# Patient Record
Sex: Female | Born: 1985 | Race: White | Hispanic: No | Marital: Married | Smoking: Former smoker
Health system: Southern US, Community
[De-identification: ages and names within clinical notes are randomized; demographics above are authoritative.]

## PROBLEM LIST (undated history)

## (undated) DIAGNOSIS — F329 Major depressive disorder, single episode, unspecified: Secondary | ICD-10-CM

## (undated) DIAGNOSIS — F32A Depression, unspecified: Secondary | ICD-10-CM

---

## 2016-09-27 ENCOUNTER — Encounter (HOSPITAL_COMMUNITY): Payer: Self-pay

## 2016-09-27 DIAGNOSIS — F3130 Bipolar disorder, current episode depressed, mild or moderate severity, unspecified: Secondary | ICD-10-CM | POA: Insufficient documentation

## 2016-09-27 DIAGNOSIS — Z23 Encounter for immunization: Secondary | ICD-10-CM | POA: Insufficient documentation

## 2016-09-27 DIAGNOSIS — F17200 Nicotine dependence, unspecified, uncomplicated: Secondary | ICD-10-CM | POA: Insufficient documentation

## 2016-09-27 DIAGNOSIS — R45851 Suicidal ideations: Secondary | ICD-10-CM | POA: Insufficient documentation

## 2016-09-27 LAB — COMPREHENSIVE METABOLIC PANEL
ALT: 20 U/L (ref 14–54)
AST: 28 U/L (ref 15–41)
Albumin: 4.1 g/dL (ref 3.5–5.0)
Alkaline Phosphatase: 38 U/L (ref 38–126)
Anion gap: 7 (ref 5–15)
BUN: 10 mg/dL (ref 6–20)
CHLORIDE: 106 mmol/L (ref 101–111)
CO2: 25 mmol/L (ref 22–32)
CREATININE: 0.78 mg/dL (ref 0.44–1.00)
Calcium: 8.9 mg/dL (ref 8.9–10.3)
Glucose, Bld: 119 mg/dL — ABNORMAL HIGH (ref 65–99)
POTASSIUM: 3.5 mmol/L (ref 3.5–5.1)
Sodium: 138 mmol/L (ref 135–145)
Total Bilirubin: 0.8 mg/dL (ref 0.3–1.2)
Total Protein: 6.5 g/dL (ref 6.5–8.1)

## 2016-09-27 LAB — RAPID URINE DRUG SCREEN, HOSP PERFORMED
AMPHETAMINES: NOT DETECTED
Barbiturates: NOT DETECTED
Benzodiazepines: NOT DETECTED
Cocaine: NOT DETECTED
OPIATES: NOT DETECTED
Tetrahydrocannabinol: POSITIVE — AB

## 2016-09-27 LAB — CBC
HCT: 38.3 % (ref 36.0–46.0)
Hemoglobin: 12.8 g/dL (ref 12.0–15.0)
MCH: 29 pg (ref 26.0–34.0)
MCHC: 33.4 g/dL (ref 30.0–36.0)
MCV: 86.7 fL (ref 78.0–100.0)
PLATELETS: 240 10*3/uL (ref 150–400)
RBC: 4.42 MIL/uL (ref 3.87–5.11)
RDW: 12.4 % (ref 11.5–15.5)
WBC: 9.4 10*3/uL (ref 4.0–10.5)

## 2016-09-27 LAB — ETHANOL

## 2016-09-27 LAB — SALICYLATE LEVEL

## 2016-09-27 LAB — ACETAMINOPHEN LEVEL: Acetaminophen (Tylenol), Serum: <10 ug/mL — ABNORMAL LOW (ref 10–30)

## 2016-09-27 NOTE — ED Triage Notes (Signed)
Pt states that she was having SI thought today, began to cut herself with a tac, several superficial scratch marks to L upper arm and R thigh. Pt has a history of cutting, denies HI/AVH. States she has been off her medication for the past month.

## 2016-09-27 NOTE — ED Notes (Signed)
Pt belongings in locker 6, placed in burgundy scrubs

## 2016-09-28 ENCOUNTER — Emergency Department (HOSPITAL_COMMUNITY)
Admission: EM | Admit: 2016-09-28 | Discharge: 2016-09-28 | Disposition: A | Discharge status: Other Acute Inpatient Hospital | Payer: Medicaid Other | Attending: Emergency Medicine | Admitting: Emergency Medicine

## 2016-09-28 ENCOUNTER — Inpatient Hospital Stay (HOSPITAL_COMMUNITY)
Admission: EM | Admit: 2016-09-28 | Discharge: 2016-10-02 | DRG: 885 | Disposition: A | Discharge status: Home / Self Care | Payer: Medicaid Other | Source: Intra-hospital | Attending: Psychiatry | Admitting: Psychiatry

## 2016-09-28 ENCOUNTER — Encounter (HOSPITAL_COMMUNITY): Payer: Self-pay | Admitting: *Deleted

## 2016-09-28 DIAGNOSIS — F319 Bipolar disorder, unspecified: Secondary | ICD-10-CM | POA: Diagnosis present

## 2016-09-28 DIAGNOSIS — Z9114 Patient's other noncompliance with medication regimen: Secondary | ICD-10-CM | POA: Diagnosis not present

## 2016-09-28 DIAGNOSIS — Z23 Encounter for immunization: Secondary | ICD-10-CM

## 2016-09-28 DIAGNOSIS — S71111A Laceration without foreign body, right thigh, initial encounter: Secondary | ICD-10-CM | POA: Diagnosis not present

## 2016-09-28 DIAGNOSIS — R450 Nervousness: Secondary | ICD-10-CM | POA: Diagnosis not present

## 2016-09-28 DIAGNOSIS — G4700 Insomnia, unspecified: Secondary | ICD-10-CM | POA: Diagnosis present

## 2016-09-28 DIAGNOSIS — X789XXA Intentional self-harm by unspecified sharp object, initial encounter: Secondary | ICD-10-CM | POA: Diagnosis not present

## 2016-09-28 DIAGNOSIS — F4010 Social phobia, unspecified: Secondary | ICD-10-CM | POA: Diagnosis not present

## 2016-09-28 DIAGNOSIS — F3162 Bipolar disorder, current episode mixed, moderate: Secondary | ICD-10-CM | POA: Diagnosis not present

## 2016-09-28 DIAGNOSIS — F3130 Bipolar disorder, current episode depressed, mild or moderate severity, unspecified: Secondary | ICD-10-CM

## 2016-09-28 DIAGNOSIS — F39 Unspecified mood [affective] disorder: Secondary | ICD-10-CM | POA: Diagnosis not present

## 2016-09-28 DIAGNOSIS — T1491XA Suicide attempt, initial encounter: Secondary | ICD-10-CM | POA: Diagnosis not present

## 2016-09-28 DIAGNOSIS — R413 Other amnesia: Secondary | ICD-10-CM | POA: Diagnosis not present

## 2016-09-28 DIAGNOSIS — R45851 Suicidal ideations: Secondary | ICD-10-CM | POA: Diagnosis present

## 2016-09-28 DIAGNOSIS — R5383 Other fatigue: Secondary | ICD-10-CM | POA: Diagnosis not present

## 2016-09-28 DIAGNOSIS — F332 Major depressive disorder, recurrent severe without psychotic features: Secondary | ICD-10-CM | POA: Diagnosis present

## 2016-09-28 DIAGNOSIS — Z818 Family history of other mental and behavioral disorders: Secondary | ICD-10-CM | POA: Diagnosis not present

## 2016-09-28 DIAGNOSIS — F4310 Post-traumatic stress disorder, unspecified: Secondary | ICD-10-CM | POA: Diagnosis present

## 2016-09-28 DIAGNOSIS — F17210 Nicotine dependence, cigarettes, uncomplicated: Secondary | ICD-10-CM | POA: Diagnosis present

## 2016-09-28 DIAGNOSIS — F314 Bipolar disorder, current episode depressed, severe, without psychotic features: Secondary | ICD-10-CM | POA: Diagnosis not present

## 2016-09-28 DIAGNOSIS — F419 Anxiety disorder, unspecified: Secondary | ICD-10-CM | POA: Diagnosis present

## 2016-09-28 DIAGNOSIS — R4587 Impulsiveness: Secondary | ICD-10-CM | POA: Diagnosis not present

## 2016-09-28 DIAGNOSIS — F988 Other specified behavioral and emotional disorders with onset usually occurring in childhood and adolescence: Secondary | ICD-10-CM | POA: Diagnosis present

## 2016-09-28 DIAGNOSIS — S41112A Laceration without foreign body of left upper arm, initial encounter: Secondary | ICD-10-CM | POA: Diagnosis not present

## 2016-09-28 DIAGNOSIS — F410 Panic disorder [episodic paroxysmal anxiety] without agoraphobia: Secondary | ICD-10-CM | POA: Diagnosis not present

## 2016-09-28 HISTORY — DX: Major depressive disorder, single episode, unspecified: F32.9

## 2016-09-28 HISTORY — DX: Depression, unspecified: F32.A

## 2016-09-28 LAB — PREGNANCY, URINE: PREG TEST UR: NEGATIVE

## 2016-09-28 MED ORDER — TETANUS-DIPHTH-ACELL PERTUSSIS 5-2.5-18.5 LF-MCG/0.5 IM SUSP
0.5000 mL | Freq: Once | INTRAMUSCULAR | Status: AC
  Administered 2016-09-28: 0.5 mL via INTRAMUSCULAR
  Filled 2016-09-28: qty 0.5

## 2016-09-28 MED ORDER — MAGNESIUM HYDROXIDE 400 MG/5ML PO SUSP
30.0000 mL | Freq: Every day | ORAL | Status: DC | PRN
  Administered 2016-10-01: 30 mL via ORAL
  Filled 2016-09-28: qty 30

## 2016-09-28 MED ORDER — INFLUENZA VAC SPLIT QUAD 0.5 ML IM SUSY
0.5000 mL | PREFILLED_SYRINGE | INTRAMUSCULAR | Status: AC
  Administered 2016-09-30: 0.5 mL via INTRAMUSCULAR
  Filled 2016-09-28: qty 0.5

## 2016-09-28 MED ORDER — PNEUMOCOCCAL VAC POLYVALENT 25 MCG/0.5ML IJ INJ
0.5000 mL | INJECTION | INTRAMUSCULAR | Status: AC
  Administered 2016-09-30: 0.5 mL via INTRAMUSCULAR

## 2016-09-28 MED ORDER — NICOTINE 21 MG/24HR TD PT24
21.0000 mg | MEDICATED_PATCH | Freq: Every day | TRANSDERMAL | Status: DC
  Administered 2016-09-28: 21 mg via TRANSDERMAL
  Filled 2016-09-28: qty 1

## 2016-09-28 MED ORDER — ACETAMINOPHEN 325 MG PO TABS
650.0000 mg | ORAL_TABLET | ORAL | Status: DC | PRN
  Administered 2016-09-28 (×2): 650 mg via ORAL
  Filled 2016-09-28 (×2): qty 2

## 2016-09-28 MED ORDER — HYDROXYZINE HCL 25 MG PO TABS
25.0000 mg | ORAL_TABLET | Freq: Four times a day (QID) | ORAL | Status: DC | PRN
  Administered 2016-09-28: 25 mg via ORAL
  Filled 2016-09-28: qty 1

## 2016-09-28 MED ORDER — ONDANSETRON HCL 4 MG PO TABS
4.0000 mg | ORAL_TABLET | Freq: Three times a day (TID) | ORAL | Status: DC | PRN
  Administered 2016-09-28: 4 mg via ORAL
  Filled 2016-09-28: qty 1

## 2016-09-28 MED ORDER — TRAZODONE HCL 50 MG PO TABS
50.0000 mg | ORAL_TABLET | Freq: Every evening | ORAL | Status: DC | PRN
  Administered 2016-09-28: 50 mg via ORAL
  Filled 2016-09-28: qty 1

## 2016-09-28 MED ORDER — IBUPROFEN 400 MG PO TABS
400.0000 mg | ORAL_TABLET | Freq: Four times a day (QID) | ORAL | Status: DC | PRN
  Administered 2016-09-28: 400 mg via ORAL
  Filled 2016-09-28: qty 1

## 2016-09-28 MED ORDER — ALUM & MAG HYDROXIDE-SIMETH 200-200-20 MG/5ML PO SUSP: 30.0000 mL | ORAL | Status: DC | PRN

## 2016-09-28 MED ORDER — NICOTINE 21 MG/24HR TD PT24
21.0000 mg | MEDICATED_PATCH | Freq: Every day | TRANSDERMAL | Status: DC
  Filled 2016-09-28 (×2): qty 1

## 2016-09-28 NOTE — ED Provider Notes (Signed)
[facility] DEPT Provider Note   CSN: [id number] Arrival date & time: 09/27/16  2137     History   Chief Complaint Chief Complaint  Patient presents with  . Suicidal    HPI Jordan Gibbs is a 31 y.o. female.  31 year old female with a history of depression presents to the emergency department for evaluation of worsening suicidal ideations. Patient states that she had thoughts of "ending it all" today. She cut herself on her right thigh and her left arm with a tack to "feel the pain'". She cannot recall the date of her last tetanus shot. She has been noncompliant with her psychiatric medications over the last 2 months. She was previously seen by a therapist, but stopped going to follow-up. She states that she has been on medications since young age, but doesn't feel as though it helps her. She denies any homicidal ideations, auditory or visual hallucinations, alcohol use, or illicit drug use. She expresses worsening feelings of helplessness and hopelessness.   The history is provided by the patient. No language interpreter was used.    Past Medical History:  Diagnosis Date  . Depression     There are no active problems to display for this patient.   History reviewed. No pertinent surgical history.  OB History    No data available       Home Medications    Prior to Admission medications   Not on File    Family History No family history on file.  Social History Social History  Substance Use Topics  . Smoking status: Current Every Day Smoker  . Smokeless tobacco: Never Used  . Alcohol use No     Allergies   Patient has no known allergies.   Review of Systems Review of Systems Ten systems reviewed and are negative for acute change, except as noted in the HPI.    Physical Exam Updated Vital Signs BP (!) 139/95 (BP Location: Left Arm)   Pulse 75   Temp 98.2 F (36.8 C) (Oral)   Resp 17   Ht  (1.676 m)   Wt 129.3 kg (285 lb)   LMP 09/26/2016    SpO2 100%   BMI 46.00 kg/m   Physical Exam  Constitutional: She is oriented to person, place, and time. She appears well-developed and well-nourished. No distress.  Nontoxic and in NAD  HENT:  Head: Normocephalic and atraumatic.  Eyes: Conjunctivae and EOM are normal. No scleral icterus.  Neck: Normal range of motion.  Pulmonary/Chest: Effort normal. No respiratory distress.  Musculoskeletal: Normal range of motion.  Neurological: She is alert and oriented to person, place, and time.  Skin: Skin is warm and dry. No rash noted. She is not diaphoretic. No erythema. No pallor.  Superficial abrasion to the proximal LUE x 2  Psychiatric: Her speech is normal and behavior is normal. She exhibits a depressed mood. She expresses suicidal ideation. She expresses no homicidal ideation. She expresses no homicidal plans.  Patient calm and cooperative  Nursing note and vitals reviewed.    ED Treatments / Results  Labs (all labs ordered are listed, but only abnormal results are displayed) Labs Reviewed  COMPREHENSIVE METABOLIC PANEL - Abnormal; Notable for the following:       Result Value   Glucose, Bld 119 (*)    All other components within normal limits  ACETAMINOPHEN LEVEL - Abnormal; Notable for the following:    Acetaminophen (Tylenol), Serum <10 (*)    All other components within normal limits  RAPID URINE DRUG SCREEN, HOSP PERFORMED - Abnormal; Notable for the following:    Tetrahydrocannabinol POSITIVE (*)    All other components within normal limits  ETHANOL  SALICYLATE LEVEL  CBC  PREGNANCY, URINE    EKG  EKG Interpretation None       Radiology No results found.  Procedures Procedures (including critical care time)  Medications Ordered in ED Medications  acetaminophen (TYLENOL) tablet 650 mg (650 mg Oral Given 09/28/16 0218)  ondansetron (ZOFRAN) tablet 4 mg (4 mg Oral Given 09/28/16 0222)  nicotine (NICODERM CQ - dosed in mg/24 hours) patch 21 mg (not  administered)  Tdap (BOOSTRIX) injection 0.5 mL (0.5 mLs Intramuscular Given 09/28/16 0219)     Initial Impression / Assessment and Plan / ED Course  I have reviewed the triage vital signs and the nursing notes.  Pertinent labs & imaging results that were available during my care of the patient were reviewed by me and considered in my medical decision making (see chart for details).     Patient presents for worsening depression. She has been recommended for inpatient psychiatric management following TTS evaluation. Patient medically cleared. Pending transport when bed available at [facility].   Final Clinical Impressions(s) / ED Diagnoses   Final diagnoses:  Bipolar affective disorder, current episode depressed, current episode severity unspecified [facility])    New Prescriptions New Prescriptions   No medications on file     Antony Madura, PA-C 09/28/16 0510    Ward, Layla Maw, DO 09/28/16 [phone]

## 2016-09-28 NOTE — ED Provider Notes (Signed)
Patient is assessed for transport. He has been accepted for inpatient treatment by Dr. Jama Flavors. Patient is alert and nontoxic. Patient reports she has had a migraine since yesterday but it is getting better now. She is alert and in no acute distress. Ocular motions normal. Neck supple. Movements coordinated purposeful symmetric. Abdomen soft and nontender. Patient is stable for transport.   Arby Barrette, MD 09/28/16 [phone]

## 2016-09-28 NOTE — ED Notes (Signed)
Patient placed first phone call x 5 minutes at nurse's desk. Very pleasant. Calm.

## 2016-09-28 NOTE — ED Notes (Signed)
Patient calling family to notify that she will be transferred to [facility].

## 2016-09-28 NOTE — Progress Notes (Signed)
D.  Pt pleasant on approach, denies complaints at this time.  Pt was positive for evening wrap up group, observed engaged in appropriate interaction with peers on the unit.  Pt denies SI/HI/AVH at this time.  Pt is a new admission, see admission note.  A.  Support and encouragement offered, medication given as ordered  R. Pt remains safe on the unit, will continue to monitor.
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
## 2016-09-28 NOTE — Progress Notes (Signed)
Ivet is a 31 year old female pt admitted on voluntary basis. On admission, pt spoke about how she has been feeling depressed and suicidal but currently denies any SI and is able to contract for safety while on the unit. She reports multiple hospitalizations in the past and reports last one about a year and a half ago and was in [place]. Pt reports that she is not currently on any medications and reports that she has been on many in the past. She does report she would like to get back on some type of medication to help with her depression. She reports that she lives with her spouse, her sister and her father and reports that she will go back there after discharge. Swan was oriented to the unit and safety maintained.

## 2016-09-28 NOTE — Progress Notes (Signed)
Per Berneice Heinrich , [facility], patient has been accepted to [facility], bed 402-1 ; Accepting provider is Assunta Found, NP; Attending provider is Dr. Jama Flavors.  Patient can arrive at 1:00pm. Number for report is [phone].    Lucky Rathke, RN notified.    Baldo Daub MSW, LCSWA CSW Disposition [phone]
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
## 2016-09-29 DIAGNOSIS — F4010 Social phobia, unspecified: Secondary | ICD-10-CM

## 2016-09-29 DIAGNOSIS — T1491XA Suicide attempt, initial encounter: Secondary | ICD-10-CM

## 2016-09-29 DIAGNOSIS — S71111A Laceration without foreign body, right thigh, initial encounter: Secondary | ICD-10-CM

## 2016-09-29 DIAGNOSIS — F3162 Bipolar disorder, current episode mixed, moderate: Secondary | ICD-10-CM

## 2016-09-29 DIAGNOSIS — F410 Panic disorder [episodic paroxysmal anxiety] without agoraphobia: Secondary | ICD-10-CM

## 2016-09-29 DIAGNOSIS — Z818 Family history of other mental and behavioral disorders: Secondary | ICD-10-CM

## 2016-09-29 DIAGNOSIS — F1910 Other psychoactive substance abuse, uncomplicated: Secondary | ICD-10-CM

## 2016-09-29 DIAGNOSIS — R5383 Other fatigue: Secondary | ICD-10-CM

## 2016-09-29 DIAGNOSIS — S41112A Laceration without foreign body of left upper arm, initial encounter: Secondary | ICD-10-CM

## 2016-09-29 MED ORDER — TRAZODONE HCL 100 MG PO TABS
100.0000 mg | ORAL_TABLET | Freq: Every evening | ORAL | Status: DC | PRN
  Administered 2016-09-29 – 2016-10-01 (×3): 100 mg via ORAL
  Filled 2016-09-29 (×3): qty 1

## 2016-09-29 MED ORDER — ONDANSETRON 4 MG PO TBDP
ORAL_TABLET | ORAL | Status: AC
  Filled 2016-09-29: qty 2

## 2016-09-29 MED ORDER — ONDANSETRON HCL 4 MG PO TABS
8.0000 mg | ORAL_TABLET | Freq: Three times a day (TID) | ORAL | Status: DC | PRN
  Administered 2016-09-29 – 2016-09-30 (×2): 8 mg via ORAL
  Filled 2016-09-29: qty 2

## 2016-09-29 MED ORDER — ARIPIPRAZOLE 5 MG PO TABS
5.0000 mg | ORAL_TABLET | Freq: Every day | ORAL | Status: DC
  Administered 2016-09-30 – 2016-10-02 (×3): 5 mg via ORAL
  Filled 2016-09-29 (×5): qty 1

## 2016-09-29 MED ORDER — VENLAFAXINE HCL ER 37.5 MG PO CP24
37.5000 mg | ORAL_CAPSULE | Freq: Every day | ORAL | Status: DC
  Administered 2016-09-29 – 2016-09-30 (×2): 37.5 mg via ORAL
  Filled 2016-09-29 (×4): qty 1

## 2016-09-29 MED ORDER — HYDROXYZINE HCL 50 MG PO TABS
50.0000 mg | ORAL_TABLET | Freq: Three times a day (TID) | ORAL | Status: DC | PRN
  Administered 2016-09-29 – 2016-09-30 (×2): 50 mg via ORAL
  Filled 2016-09-29 (×2): qty 1

## 2016-09-29 MED ORDER — ARIPIPRAZOLE 2 MG PO TABS
2.0000 mg | ORAL_TABLET | Freq: Every day | ORAL | Status: DC
  Administered 2016-09-29: 2 mg via ORAL
  Filled 2016-09-29 (×3): qty 1

## 2016-09-29 MED ORDER — IBUPROFEN 600 MG PO TABS
600.0000 mg | ORAL_TABLET | Freq: Four times a day (QID) | ORAL | Status: DC | PRN
  Administered 2016-09-29 – 2016-10-01 (×4): 600 mg via ORAL
  Filled 2016-09-29 (×3): qty 1

## 2016-09-29 MED ORDER — SUMATRIPTAN SUCCINATE 50 MG PO TABS
50.0000 mg | ORAL_TABLET | ORAL | Status: DC | PRN
  Administered 2016-09-29: 50 mg via ORAL
  Filled 2016-09-29 (×2): qty 1

## 2016-09-29 NOTE — Progress Notes (Signed)
D.  Pt pleasant on approach, stated that she had no been able to eat dinner so did eat a snack.  Pt later reported she vomited again after eating snack, denies headache or stiffness in neck, but does report abdominal cramping in upper abdomen.  Pt went to bed shortly after 2115 and has been resting with eyes closed, respirations even and unlabored, no discomfort noted since that time.  Pt denied SI/HI/AVH.  A.  Support and encouragement offered, medications given as ordered.  No further vomiting noted at this time.  R. Pt remains safe on the unit, will continue to monitor.

## 2016-09-29 NOTE — Progress Notes (Signed)
D.  Pt has had headache, vomiting on day shift, no headache now but Pt complains of upper abdominal cramping as well as vomiting after eating a snack.  Pt's temperature has been going gradually up, was  99.3 on days, now 100.1.  A.  Spoke with NP and due to possible virus, Pt ordered no roommate for tonight.  Pt given pitcher of Gatorade along with her night medications.  Support and encouragement offered, medication given as ordered.  Pt reminded to call at any time during the night if help is needed..    R. Pt remains safe on unit, will continue to monitor.

## 2016-09-29 NOTE — BHH Group Notes (Signed)
Identifying Needs   Date:  09/29/2016  Time:  1100  Type of Therapy:  Nurse Education  The group focuses on teaching patients how to identify their needs and then how to develop skills needed to get them met.   Participation Level:  Active  Participation Quality:  Attentive  Affect:  Appropriate  Cognitive:  Alert  Insight:  Improving  Engagement in Group:  Engaged  Modes of Intervention:  Education  Summary of Progress/Problems:  Lauralyn Primes 09/29/2016, 4:12 PM

## 2016-09-29 NOTE — Progress Notes (Signed)
Adult Psychoeducational Group Note  Date:  09/29/2016 Time:  8:52 PM  Group Topic/Focus:  Wrap-Up Group:   The focus of this group is to help patients review their daily goal of treatment and discuss progress on daily workbooks.  Participation Level:  Active  Participation Quality:  Appropriate  Affect:  Appropriate  Cognitive:  Appropriate  Insight: Appropriate  Engagement in Group:  Engaged  Modes of Intervention:  Discussion  Additional Comments: Pt stated her goal for today was to interact more with peers and not sleep as much. Pt stated she accomplished her goal today and felt good about it. Pt rated her overall day a 9 out of 10. Pt stated she did not attend the morning group but attended the other two groups. Pt stated her goal for tomorrow was to attend all groups held.  [name] 09/29/2016, 8:52 PM

## 2016-09-29 NOTE — BHH Group Notes (Signed)
LCSW Group Therapy Note  Date/Time:  09/29/2016   10:00AM-11:00AM  Type of Therapy and Topic:  Group Therapy:  Fears and Unhealthy/Healthy Coping Skills  Participation Level:  Active   Description of Group:  The focus of this group was to discuss some of the prevalent fears that patients experience, and to identify the commonalities among group members.  An exercise was used to initiate the discussion, followed by writing on the white board a group-generated list of unhealthy coping and healthy coping techniques to deal with each fear.    Therapeutic Goals: 1. Patient will be able to distinguish between healthy and unhealthy coping skills 2. Patient will identify and describe 3 fears they experience 3. Patient will identify one positive coping strategy for each fear they experience 4. Patient will respond empathetically to peers' statements regarding fears they experience  Summary of Patient Progress:  The patient expressed she has a fear of failing at her marriage.  She stated she would like to work on learning how to incorporate meditation into her life.  Therapeutic Modalities Cognitive Behavioral Therapy Motivational Interviewing  Ambrose Mantle, LCSW
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
## 2016-09-29 NOTE — Plan of Care (Signed)
Problem: Activity: Goal: Interest or engagement in leisure activities will improve Outcome: Not Progressing Patient has spent most of the shift in bed. Goal: Imbalance in normal sleep/wake cycle will improve Outcome: Not Progressing Patient has spent most of shift in bed.  Problem: Health Behavior/Discharge Planning: Goal: Compliance with therapeutic regimen will improve Outcome: Progressing Patient has taken medications as prescribed.

## 2016-09-29 NOTE — Progress Notes (Signed)
Data. Patient denies SI/HI/AVH. Verbally contracts for safety on the unit and to come to staff before acting of any self harm thoughts/feelings. Patient's affect has been pinched most of the day and she has spent most of the shift in bed with a headache, minimally treated by medication. VS WNL. In the morning, patient C/O pain over her eyes. By mid afternoon, pain had moved to the back of her head and she was C/O neck stiffness and pain with eye movement. No N/V. Grips and pushes WDL, A &O x 4. NP and MD notified of findings. Imitrex ordered and administered. Ice pack given. Patient did not go to meal, reporting at the time that she was slightly nauseous, but that her pain was almost completely gone. At 6:30pm patient reported that she had begun vomiting. VS WNL except for temp. Temp continues to trend up. On call MD notified and new orders received. Zofran given. On coming nurse notified.  Action. Emotional support and encouragement offered. Education provided on medication, indications and side effect. Q 15 minute checks done for safety. Response. Safety on the unit maintained through 15 minute checks.  Medications taken as prescribed.

## 2016-09-29 NOTE — [facility] Suicide Risk Assessment (Signed)
[facility] Admission Suicide Risk Assessment   Nursing information obtained from:    Demographic factors:    Current Mental Status:    Loss Factors:    Historical Factors:    Risk Reduction Factors:     Total Time spent with patient: 30 minutes Principal Problem: MDD (major depressive disorder), recurrent episode, severe ([facility]) Diagnosis:   Patient Active Problem List   Diagnosis Date Noted  . MDD (major depressive disorder), recurrent episode, severe ([facility]) [F33.2] 09/28/2016   Subjective Data:  31 y.o Caucasian female, married, lives with her wife, sister and her sister's kids. 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 Background history of Early life trauma, PTSD and repeated suicidal behavior. Presented to the ER voluntarily on account of worsening suicidal thoughts. Inflicted superficial lacerations to her forearm. Reports major stressor is interfamily relationship. Says she gets irritable and takes her anger out on others.  Routine labs are essentially normal.  UDS is positive for THC.  BAL <5  Mg/dl. Patient witnessed her own mom's suicide. She was raised mainly in foster care. Reports history of mood swings. Says she easily takes out her anger on others. Very minimal sleep at night. No associated psychosis. No associated thoughts of homicide or violence. No legal issues. Has been living here for six months. Has been married for three months.  Patient has been on various medications over the years. She gained weight on Seroquel. She is currently on Bupropion. She has agreed to take Abilify and Venlafaxine.    Continued Clinical Symptoms:  Alcohol Use Disorder Identification Test Final Score (AUDIT): 0 The "Alcohol Use Disorders Identification Test", Guidelines for Use in Primary Care, Second Edition.  World Science writer [facility]). Score between 0-7:  no or low risk or alcohol related problems. Score between 8-15:  moderate risk of alcohol related problems. Score between 16-19:  high risk of  alcohol related problems. Score 20 or above:  warrants further diagnostic evaluation for alcohol dependence and treatment.   CLINICAL FACTORS:   Bipolar Disorder:   Mixed State   Musculoskeletal: Strength & Muscle Tone: within normal limits Gait & Station: normal Patient leans: N/A  Psychiatric Specialty Exam: Physical Exam  ROS  Blood pressure (!) 100/36, pulse 100, temperature 99 F (37.2 C), temperature source Oral, resp. rate 16, height  (1.676 m), weight 124.3 kg (274 lb), last menstrual period 09/26/2016.Body mass index is 44.22 kg/m.  General Appearance: Overweight, good rapport. Not internally distracted.   Eye Contact:  Good  Speech:  Not pressured or loud.   Volume:  Normal  Mood:  Irritable  Affect:  Restricted  Thought Process:  Linear  Orientation:  Full (Time, Place, and Person)  Thought Content:  No delusional theme. No preoccupation with violent thoughts.  No hallucination in any modality.   Suicidal Thoughts:  Feels safe here. Has agreed to ventilate feelings with staff if the thoughts becomes overwhelming.   Homicidal Thoughts:  No  Memory:  Immediate;   Good Recent;   Fair Remote;   Good  Judgement:  Fair  Insight:  Good  Psychomotor Activity:  Normal  Concentration:  Concentration: Fair and Attention Span: Fair  Recall:  Good  Fund of Knowledge:  Good  Language:  Good  Akathisia:  Negative  Handed:    AIMS (if indicated):     Assets:  Communication Skills Desire for Improvement Housing Intimacy Physical Health Resilience  ADL's:  Intact  Cognition:  WNL  Sleep:  Number of Hours: 7  COGNITIVE FEATURES THAT CONTRIBUTE TO RISK:  None    SUICIDE RISK:   Mild:  Suicidal ideation of limited frequency, intensity, duration, and specificity.  There are no identifiable plans, no associated intent, mild dysphoria and related symptoms, good self-control (both objective and subjective assessment), few other risk factors, and identifiable  protective factors, including available and accessible social support.  PLAN OF CARE:  1. Suicide precautions 2. Abilify and Venlafaxine to target mood lability 3. Collateral from her wife  I certify that inpatient services furnished can reasonably be expected to improve the patient's condition.   Georgiann Cocker, MD 09/29/2016, 12:28 PM

## 2016-09-30 DIAGNOSIS — F314 Bipolar disorder, current episode depressed, severe, without psychotic features: Secondary | ICD-10-CM

## 2016-09-30 DIAGNOSIS — F17210 Nicotine dependence, cigarettes, uncomplicated: Secondary | ICD-10-CM

## 2016-09-30 DIAGNOSIS — R450 Nervousness: Secondary | ICD-10-CM

## 2016-09-30 DIAGNOSIS — R4587 Impulsiveness: Secondary | ICD-10-CM

## 2016-09-30 DIAGNOSIS — F419 Anxiety disorder, unspecified: Secondary | ICD-10-CM

## 2016-09-30 DIAGNOSIS — R413 Other amnesia: Secondary | ICD-10-CM

## 2016-09-30 DIAGNOSIS — G4700 Insomnia, unspecified: Secondary | ICD-10-CM

## 2016-09-30 MED ORDER — VENLAFAXINE HCL ER 75 MG PO CP24
75.0000 mg | ORAL_CAPSULE | Freq: Every day | ORAL | Status: DC
  Administered 2016-10-01 – 2016-10-02 (×2): 75 mg via ORAL
  Filled 2016-09-30 (×4): qty 1

## 2016-09-30 NOTE — Progress Notes (Signed)
[facility] MD Progress Note  09/30/2016 1:57 PM Jordan Gibbs  MRN:  [id number]    Subjective:  Patient reports that she is feeling a little better today.  Reports that she is not currently having suicidal thoughts or thoughts of injurious behavior.  States that she was started on Abilify and is tolerating the medication.  States that she has been eating/sleeping without difficulty and going to group sessions.  At this time she is rating depression 2/10 and anxiety 5/10.  Objective:  Jordan Gibbs, 31 y.o., female patient seen by this provider.  Chart reviewed and face to face evaluation on 09/30/16.   On evaluation:  Jordan Gibbs presents pleasant affect, calm/cooperative, and alert/oriented x 4.  Continues to endorse depression but minimizing level of depression.  Reports that she has spoken to her wife and things are fine.  At this time denies suicidal/homicidal thoughts, self injurious behavior psychosis, and paranoia.  Patient seen interacting with staff and peer appropriately.  Tolerating medications without adverse reactions will increase Effexor XR to 75 mg, sleeping and eating without difficulty.  Will continue to monitor; continue current treatment plan without changes at this time.  Principal Problem: Bipolar affective disorder [facility]) Diagnosis:   Patient Active Problem List   Diagnosis Date Noted  . Bipolar affective disorder (HCC) [F31.9] 09/28/2016   Total Time spent with patient: 30 minutes  Past Psychiatric History: Major Depressive Disorder, PTSD, ADD  Past Medical History:  Past Medical History:  Diagnosis Date  . Depression    History reviewed. No pertinent surgical history. Family History: History reviewed. No pertinent family history. Family Psychiatric  History: Mother died from suicide Social History:  History  Alcohol Use No     History  Drug Use No    Social History   Social History  . Marital status: Married    Spouse name: N/A  . Number of children: N/A  .  Years of education: N/A   Social History Main Topics  . Smoking status: Current Every Day Smoker    Packs/day: 0.25  . Smokeless tobacco: Never Used  . Alcohol use No  . Drug use: No  . Sexual activity: Not Asked   Other Topics Concern  . None   Social History Narrative  . None   Additional Social History:                         Sleep: Good  Appetite:  Good  Current Medications: Current Facility-Administered Medications  Medication Dose Route Frequency Provider Last Rate Last Dose  . alum & mag hydroxide-simeth (MAALOX/MYLANTA) 200-200-20 MG/5ML suspension 30 mL  30 mL Oral Q4H PRN Rankin, Shuvon B, NP      . ARIPiprazole (ABILIFY) tablet 5 mg  5 mg Oral Daily Izediuno, Vincent A, MD   5 mg at 09/30/16 0956  . hydrOXYzine (ATARAX/VISTARIL) tablet 50 mg  50 mg Oral TID PRN Money, Gerlene Burdock, FNP   50 mg at 09/29/16 2104  . ibuprofen (ADVIL,MOTRIN) tablet 600 mg  600 mg Oral Q6H PRN Money, Gerlene Burdock, FNP   600 mg at 09/29/16 1106  . magnesium hydroxide (MILK OF MAGNESIA) suspension 30 mL  30 mL Oral Daily PRN Rankin, Shuvon B, NP      . ondansetron (ZOFRAN) tablet 8 mg  8 mg Oral Q8H PRN Hisada, Reina, MD   8 mg at 09/30/16 1146  . SUMAtriptan (IMITREX) tablet 50 mg  50 mg Oral Q2H PRN Money, Gerlene Burdock,  FNP   50 mg at 09/29/16 1539  . traZODone (DESYREL) tablet 100 mg  100 mg Oral QHS PRN Money, Gerlene Burdock, FNP   100 mg at 09/29/16 2104  . venlafaxine XR (EFFEXOR-XR) 24 hr capsule 37.5 mg  37.5 mg Oral Q breakfast Money, Gerlene Burdock, FNP   37.5 mg at 09/30/16 8119    Lab Results: No results found for this or any previous visit (from the past 48 hour(s)).  Blood Alcohol level:  Lab Results  Component Value Date   ETH <10 09/27/2016    Metabolic Disorder Labs: No results found for: HGBA1C, MPG No results found for: PROLACTIN No results found for: CHOL, TRIG, HDL, CHOLHDL, VLDL, LDLCALC  Physical Findings: AIMS: Facial and Oral Movements Muscles of Facial Expression:  None, normal Lips and Perioral Area: None, normal Jaw: None, normal Tongue: None, normal,Extremity Movements Upper (arms, wrists, hands, fingers): None, normal Lower (legs, knees, ankles, toes): None, normal, Trunk Movements Neck, shoulders, hips: None, normal, Overall Severity Severity of abnormal movements (highest score from questions above): None, normal Incapacitation due to abnormal movements: None, normal Patient's awareness of abnormal movements (rate only patient's report): No Awareness, Dental Status Current problems with teeth and/or dentures?: No Does patient usually wear dentures?: No  CIWA:    COWS:     Musculoskeletal: Strength & Muscle Tone: within normal limits Gait & Station: normal Patient leans: N/A  Psychiatric Specialty Exam: Physical Exam  Nursing note and vitals reviewed. Constitutional: She is oriented to person, place, and time.  Neck: Normal range of motion.  Respiratory: Effort normal.  Musculoskeletal: Normal range of motion.  Neurological: She is alert and oriented to person, place, and time.  Skin: Skin is warm and dry.  Psychiatric: Her speech is normal and behavior is normal. Her mood appears anxious. Cognition and memory are normal. She expresses impulsivity. She exhibits a depressed mood. Suicidal: Denies at this time.    Review of Systems  Psychiatric/Behavioral: Positive for depression and memory loss. Hallucinations: Denies. Suicidal ideas: Denies at this time. The patient is nervous/anxious and has insomnia (Reports that she slept well last night).   All other systems reviewed and are negative.   Blood pressure 127/68, pulse 98, temperature 99.9 F (37.7 C), temperature source Oral, resp. rate 20, height  (1.676 m), weight 124.3 kg (274 lb), last menstrual period 09/26/2016, SpO2 99 %.Body mass index is 44.22 kg/m.  General Appearance: Casual  Eye Contact:  Good  Speech:  Clear and Coherent and Normal Rate  Volume:  Normal  Mood:   Anxious and Depressed  Affect:  Depressed and Flat  Thought Process:  Goal Directed and Descriptions of Associations: Intact  Orientation:  Full (Time, Place, and Person)  Thought Content:  WDL  Suicidal Thoughts:  No  Homicidal Thoughts:  No  Memory:  Immediate;   Good Recent;   Good Remote;   Good  Judgement:  Good  Insight:  Good  Psychomotor Activity:  Normal  Concentration:  Concentration: Good and Attention Span: Good  Recall:  Good  Fund of Knowledge:  Fair  Language:  Good  Akathisia:  No  Handed:  Right  AIMS (if indicated):     Assets:  Communication Skills Desire for Improvement Housing Social Support Transportation  ADL's:  Intact  Cognition:  WNL  Sleep:  Number of Hours: 6.75     Treatment Plan Summary: Daily contact with patient to assess and evaluate symptoms and progress in treatment, Medication management and Plan :   -  Adriyana Greenbaum was admitted to [facility] under the service of Cobos, Rockey Situ, MD for Bipolar affective disorder [facility]) crisis management, and stabilization. -Routine labs; which include CBC, CMP, UA, ETOH, Urine pregnancy, HCG, and UDS were reviewed  -medication management:  Continue Abilify 2 mg daily for mood stability Effexor XR was titrated up to 75 mg daily for Major Depression Continue Trazodone 100 mg for Insomnia Continue Vistaril 25 50 mg Tid prn for anxiety -Will maintain observation checks every 15 minutes for safety. Encourage group attendance/participation  -Social work will consult with family for collateral information and discuss discharge and follow up plan.  Rankin, Shuvon, NP 09/30/2016, 1:57 PM

## 2016-09-30 NOTE — Progress Notes (Signed)
Adult Psychoeducational Group Note  Date:  09/30/2016 Time:  9:38 PM  Group Topic/Focus:  Wrap-Up Group:   The focus of this group is to help patients review their daily goal of treatment and discuss progress on daily workbooks.  Participation Level:  Active  Participation Quality:  Appropriate  Affect:  Appropriate  Cognitive:  Appropriate  Insight: Appropriate  Engagement in Group:  Engaged  Modes of Intervention:  Discussion  Additional Comments:  Pt stated her goal for tonight was to interact more with peers. Pt stated she accomplished her goal and felt good about it. Pt rated her day a 7. Pt stated she attended all groups.  [name] 09/30/2016, 9:38 PM

## 2016-09-30 NOTE — Progress Notes (Signed)
Patient denies SI, HI and AVH.  Patient has been present on the unit attending groups and interacting appropriately with peers.   Patient has had no incidents of behavioral dyscontrol.   Assess patient for safety, offer medications as prescribed, engage patient in 1:1 staff talks.   Continue to monitor as planned. Patient able to contract for safety.  
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
## 2016-09-30 NOTE — BHH Group Notes (Signed)
[facility] LCSW Group Therapy Note  Date/Time:  09/30/2016 10:00-11:00AM  Type of Therapy and Topic:  Group Therapy:  Healthy and Unhealthy Supports  Participation Level:  Active   Description of Group:  Patients in this group were introduced to the idea of adding a variety of healthy supports to address the various needs in their lives. The picture on the front of Sunday's workbook was used to demonstrate why more supports are needed in every patient's life.  Patients identified and described healthy supports versus unhealthy supports in general, then gave examples of each in their own lives.   They discussed what additional healthy supports could be helpful in their recovery and wellness after discharge in order to prevent future hospitalizations.   An emphasis was placed on using counselor, doctor, therapy groups, 12-step groups, and problem-specific support groups to expand supports.  They also worked as a group on developing a specific plan for several patients to deal with unhealthy supports through boundary-setting, psychoeducation with loved ones, and even termination of relationships.   Therapeutic Goals:   1)  discuss importance of adding supports to stay well once out of the hospital  2)  compare healthy versus unhealthy supports and identify some examples of each  3)  generate ideas and descriptions of healthy supports that can be added  4)  offer mutual support about how to address unhealthy supports  5)  encourage active participation in and adherence to discharge plan    Summary of Patient Progress:  The patient shared that the current healthy/unhealthy supports available in their life are her wife and her aunt (healthy) and her sister (unhealthy).  She expressed an interest in adding additional supports.   Therapeutic Modalities:   Motivational Interviewing Brief Solution-Focused Therapy  Ambrose Mantle, LCSW 09/30/2016, 12:58 PM

## 2016-10-01 DIAGNOSIS — F39 Unspecified mood [affective] disorder: Secondary | ICD-10-CM

## 2016-10-01 DIAGNOSIS — X789XXA Intentional self-harm by unspecified sharp object, initial encounter: Secondary | ICD-10-CM

## 2016-10-01 DIAGNOSIS — R45851 Suicidal ideations: Secondary | ICD-10-CM

## 2016-10-01 MED ORDER — HYDROXYZINE HCL 50 MG PO TABS
50.0000 mg | ORAL_TABLET | Freq: Three times a day (TID) | ORAL | Status: DC | PRN
  Administered 2016-10-01 – 2016-10-02 (×3): 50 mg via ORAL
  Filled 2016-10-01 (×3): qty 1

## 2016-10-01 MED ORDER — ONDANSETRON 4 MG PO TBDP
8.0000 mg | ORAL_TABLET | Freq: Three times a day (TID) | ORAL | Status: DC | PRN
  Administered 2016-10-01: 8 mg via ORAL

## 2016-10-01 MED ORDER — ONDANSETRON 4 MG PO TBDP
ORAL_TABLET | ORAL | Status: AC
  Filled 2016-10-01: qty 2

## 2016-10-01 NOTE — Tx Team (Signed)
Interdisciplinary Treatment and Diagnostic Plan Update  10/01/2016 Time of Session: 9:30 AM [name] MRN: [id number]  Principal Diagnosis: Bipolar affective disorder, current episode severe (HCC)  Secondary Diagnoses: Principal Problem:   Bipolar affective disorder, current episode severe (HCC)   Current Medications:  Current Facility-Administered Medications  Medication Dose Route Frequency Provider Last Rate Last Dose  . alum & mag hydroxide-simeth (MAALOX/MYLANTA) 200-200-20 MG/5ML suspension 30 mL  30 mL Oral Q4H PRN Rankin, Shuvon B, NP      . ARIPiprazole (ABILIFY) tablet 5 mg  5 mg Oral Daily Izediuno, Vincent A, MD   5 mg at 09/30/16 0956  . hydrOXYzine (ATARAX/VISTARIL) tablet 50 mg  50 mg Oral TID PRN Money, Gerlene Burdock, FNP   50 mg at 09/30/16 2126  . ibuprofen (ADVIL,MOTRIN) tablet 600 mg  600 mg Oral Q6H PRN Money, Gerlene Burdock, FNP   600 mg at 09/30/16 1616  . magnesium hydroxide (MILK OF MAGNESIA) suspension 30 mL  30 mL Oral Daily PRN Rankin, Shuvon B, NP      . ondansetron (ZOFRAN-ODT) disintegrating tablet 8 mg  8 mg Oral Q8H PRN Cobos, Fernando A, MD      . SUMAtriptan (IMITREX) tablet 50 mg  50 mg Oral Q2H PRN Money, Gerlene Burdock, FNP   50 mg at 09/29/16 1539  . traZODone (DESYREL) tablet 100 mg  100 mg Oral QHS PRN Money, Gerlene Burdock, FNP   100 mg at 09/30/16 2126  . venlafaxine XR (EFFEXOR-XR) 24 hr capsule 75 mg  75 mg Oral Q breakfast Rankin, Shuvon B, NP       PTA Medications: No prescriptions prior to admission.    Patient Stressors: Marital or family conflict Traumatic event  Patient Strengths: Ability for insight Active sense of humor Average or above average intelligence Financial means General fund of knowledge Supportive family/friends  Treatment Modalities: Medication Management, Group therapy, Case management,  1 to 1 session with clinician, Psychoeducation, Recreational therapy.   Physician Treatment Plan for Primary Diagnosis: Bipolar affective  disorder, current episode severe (HCC) Long Term Goal(s): Improvement in symptoms so as ready for discharge Improvement in symptoms so as ready for discharge   Short Term Goals: Ability to verbalize feelings will improve Ability to disclose and discuss suicidal ideas Ability to maintain clinical measurements within normal limits will improve Compliance with prescribed medications will improve  Medication Management: Evaluate patient's response, side effects, and tolerance of medication regimen.  Therapeutic Interventions: 1 to 1 sessions, Unit Group sessions and Medication administration.  Evaluation of Outcomes: Progressing  Physician Treatment Plan for Secondary Diagnosis: Principal Problem:   Bipolar affective disorder, current episode severe (HCC)  Long Term Goal(s): Improvement in symptoms so as ready for discharge Improvement in symptoms so as ready for discharge   Short Term Goals: Ability to verbalize feelings will improve Ability to disclose and discuss suicidal ideas Ability to maintain clinical measurements within normal limits will improve Compliance with prescribed medications will improve     Medication Management: Evaluate patient's response, side effects, and tolerance of medication regimen.  Therapeutic Interventions: 1 to 1 sessions, Unit Group sessions and Medication administration.  Evaluation of Outcomes: Progressing   RN Treatment Plan for Primary Diagnosis: Bipolar affective disorder, current episode severe (HCC) Long Term Goal(s): Knowledge of disease and therapeutic regimen to maintain health will improve  Short Term Goals: Ability to demonstrate self-control, Ability to participate in decision making will improve and Ability to identify and develop effective coping behaviors will improve  Medication  Management: RN will administer medications as ordered by provider, will assess and evaluate patient's response and provide education to patient for  prescribed medication. RN will report any adverse and/or side effects to prescribing provider.  Therapeutic Interventions: 1 on 1 counseling sessions, Psychoeducation, Medication administration, Evaluate responses to treatment, Monitor vital signs and CBGs as ordered, Perform/monitor CIWA, COWS, AIMS and Fall Risk screenings as ordered, Perform wound care treatments as ordered.  Evaluation of Outcomes: Progressing   LCSW Treatment Plan for Primary Diagnosis: Bipolar affective disorder, current episode severe (HCC) Long Term Goal(s): Safe transition to appropriate next level of care at discharge, Engage patient in therapeutic group addressing interpersonal concerns.  Short Term Goals: Engage patient in aftercare planning with referrals and resources, Increase social support, Identify triggers associated with mental health/substance abuse issues and Increase skills for wellness and recovery  Therapeutic Interventions: Assess for all discharge needs, 1 to 1 time with Social worker, Explore available resources and support systems, Assess for adequacy in community support network, Educate family and significant other(s) on suicide prevention, Complete Psychosocial Assessment, Interpersonal group therapy.  Evaluation of Outcomes: Progressing   Progress in Treatment: Attending groups: Yes. Participating in groups: Yes. Taking medication as prescribed: Yes. Toleration medication: Yes. Family/Significant other contact made: No, will contact:  wife Patient understands diagnosis: Yes. Discussing patient identified problems/goals with staff: Yes. Medical problems stabilized or resolved: Yes. Denies suicidal/homicidal ideation: Yes.admitted w suidical ideation; contracts for safety, coping skills inadequate for community stressors Issues/concerns per patient self-inventory: No. Other: NA  New problem(s) identified: Yes, Describe:  wants referral to new provider  New Short Term/Long Term Goal(s):   "medications stable, healthy self expression, peer support"  Discharge Plan or Barriers: return home, follow up outpatient  Reason for Continuation of Hospitalization: Depression Medication stabilization Suicidal ideation  Estimated Length of Stay:  3 - 5 days; 10/03/16  Attendees: Patient:  Jordan Gibbs 10/01/2016 8:59 AM  Physician: Sallyanne Havers MD 10/01/2016 8:59 AM  Nursing: Elisha Headland RN 10/01/2016 8:59 AM  RN Care Manager: 10/01/2016 8:59 AM  Social Worker: Governor Rooks LCSW 10/01/2016 8:59 AM  Recreational Therapist:  10/01/2016 8:59 AM  Other:  10/01/2016 8:59 AM  Other:  10/01/2016 8:59 AM  Other: 10/01/2016 8:59 AM    Scribe for Treatment Team: Sallee Lange, LCSW 10/01/2016 8:59 AM

## 2016-10-01 NOTE — Progress Notes (Addendum)
[facility] MD Progress Note  10/01/2016 2:06 PM Jordan Gibbs  MRN:  [id number]    Subjective: patient reports she is feeling better. Denies suicidal ideations. Does not endorse medication side effects.  Objective:  I have discussed case with treatment team and have met with patient . She is a 31 year old female, who presented to ED due to worsening depression, suicidal ideations, and recent self cutting . She had not been taking psychiatric medications x several weeks prior to admission. At this time reports feeling better. Denies suicidal ideations or self injurious ideations at this time. She has been visible on unit, going to groups, pleasant on approach. No disruptive or agitated behaviors. Currently on Abilify and Effexor XR, denies side effects . States she feels medications are helping . Of note, patient reports bilateral deltoid muscle pain and  localized erythema bilaterally in this area , which is new onset and which she states occurred after receiving influenza and pneumococcal vaccinations . Patient seen with female RN. Erythema on deltoid area bilaterally.  Denies other rash or skin involvement  .There are no systemic symptoms, no mucosal involvement. Upper extremity mobility and sensation not affected.    Principal Problem: Bipolar affective disorder, current episode severe ([place]) Diagnosis:   Patient Active Problem List   Diagnosis Date Noted  . Bipolar affective disorder, current episode severe ([place]) [F31.9] 09/28/2016   Total Time spent with patient: 20 minutes  Past Psychiatric History: Major Depressive Disorder, PTSD, ADD  Past Medical History:  Past Medical History:  Diagnosis Date  . Depression    History reviewed. No pertinent surgical history. Family History: History reviewed. No pertinent family history. Family Psychiatric  History: Mother died from suicide Social History:  History  Alcohol Use No     History  Drug Use No    Social History   Social History   . Marital status: Married    Spouse name: N/A  . Number of children: N/A  . Years of education: N/A   Social History Main Topics  . Smoking status: Current Every Day Smoker    Packs/day: 0.25  . Smokeless tobacco: Never Used  . Alcohol use No  . Drug use: No  . Sexual activity: Not Asked   Other Topics Concern  . None   Social History Narrative  . None   Additional Social History:   Sleep: Good  Appetite:  Good  Current Medications: Current Facility-Administered Medications  Medication Dose Route Frequency Provider Last Rate Last Dose  . alum & mag hydroxide-simeth (MAALOX/MYLANTA) 200-200-20 MG/5ML suspension 30 mL  30 mL Oral Q4H PRN Rankin, Shuvon B, NP      . ARIPiprazole (ABILIFY) tablet 5 mg  5 mg Oral Daily Izediuno, Vincent A, MD   5 mg at 10/01/16 0933  . hydrOXYzine (ATARAX/VISTARIL) tablet 50 mg  50 mg Oral TID PRN Money, Lowry Ram, FNP      . ibuprofen (ADVIL,MOTRIN) tablet 600 mg  600 mg Oral Q6H PRN Money, Lowry Ram, FNP   600 mg at 10/01/16 0935  . magnesium hydroxide (MILK OF MAGNESIA) suspension 30 mL  30 mL Oral Daily PRN Rankin, Shuvon B, NP   30 mL at 10/01/16 0937  . ondansetron (ZOFRAN-ODT) 4 MG disintegrating tablet           . ondansetron (ZOFRAN-ODT) disintegrating tablet 8 mg  8 mg Oral Q8H PRN Cobos, Myer Peer, MD   8 mg at 10/01/16 0935  . SUMAtriptan (IMITREX) tablet 50 mg  50  mg Oral Q2H PRN Money, Lowry Ram, FNP   50 mg at 09/29/16 1539  . traZODone (DESYREL) tablet 100 mg  100 mg Oral QHS PRN Money, Lowry Ram, FNP   100 mg at 09/30/16 2126  . venlafaxine XR (EFFEXOR-XR) 24 hr capsule 75 mg  75 mg Oral Q breakfast Rankin, Shuvon B, NP   75 mg at 10/01/16 1610    Lab Results: No results found for this or any previous visit (from the past 48 hour(s)).  Blood Alcohol level:  Lab Results  Component Value Date   ETH <10 96/04/5407    Metabolic Disorder Labs: No results found for: HGBA1C, MPG No results found for: PROLACTIN No results found  for: CHOL, TRIG, HDL, CHOLHDL, VLDL, LDLCALC  Physical Findings: AIMS: Facial and Oral Movements Muscles of Facial Expression: None, normal Lips and Perioral Area: None, normal Jaw: None, normal Tongue: None, normal,Extremity Movements Upper (arms, wrists, hands, fingers): None, normal Lower (legs, knees, ankles, toes): None, normal, Trunk Movements Neck, shoulders, hips: None, normal, Overall Severity Severity of abnormal movements (highest score from questions above): None, normal Incapacitation due to abnormal movements: None, normal Patient's awareness of abnormal movements (rate only patient's report): No Awareness, Dental Status Current problems with teeth and/or dentures?: No Does patient usually wear dentures?: No  CIWA:    COWS:     Musculoskeletal: Strength & Muscle Tone: within normal limits Gait & Station: normal Patient leans: N/A  Psychiatric Specialty Exam: Physical Exam  Nursing note and vitals reviewed. Constitutional: She is oriented to person, place, and time.  Neck: Normal range of motion.  Respiratory: Effort normal.  Musculoskeletal: Normal range of motion.  Neurological: She is alert and oriented to person, place, and time.  Skin: Skin is warm and dry.  Psychiatric: Her speech is normal and behavior is normal. Her mood appears anxious. Cognition and memory are normal. She expresses impulsivity. She exhibits a depressed mood. Suicidal: Denies at this time.    Review of Systems  Psychiatric/Behavioral: Positive for depression and memory loss. Hallucinations: Denies. Suicidal ideas: Denies at this time. The patient is nervous/anxious and has insomnia (Reports that she slept well last night).   All other systems reviewed and are negative. no fever, no chills, no chest pain, no shortness of breath, no vomiting   Blood pressure 124/65, pulse (!) 114, temperature 99.7 F (37.6 C), temperature source Oral, resp. rate 20, height '5\' 6"'  (1.676 m), weight 124.3 kg  (274 lb), last menstrual period 09/26/2016, SpO2 99 %.Body mass index is 44.22 kg/m.  General Appearance: Fairly Groomed  Eye Contact:  Good  Speech:  Normal Rate  Volume:  Normal  Mood:  reports she is feeling better  Affect:  appropriate, reactive  Thought Process:  Linear and Descriptions of Associations: Intact  Orientation:  Full (Time, Place, and Person)  Thought Content:  no hallucinations, no delusions, not internally preoccupied   Suicidal Thoughts:  No denies suicidal, self injurious ideations, also denies homicidal  or violent ideations   Homicidal Thoughts:  No  Memory:  Recent and remote grossly intact   Judgement:  Good  Insight:  Good  Psychomotor Activity:  Normal  Concentration:  Concentration: Good and Attention Span: Good  Recall:  Good  Fund of Knowledge:  Good  Language:  Good  Akathisia:  No  Handed:  Right  AIMS (if indicated):     Assets:  Communication Skills Desire for Improvement Housing Social Support Transportation  ADL's:  Intact  Cognition:  WNL  Sleep:  Number of Hours: 6.75   Assessment - patient is presenting with improving mood and range of affect . She denies suicidal ideations and presents future oriented, hoping for discharge soon. Denies medication side effects ( Abilify, Effexor XR ) . Localized/bilateral  erythema and deltoid muscle pain following recent vaccination  Treatment Plan Summary: Treatment Plan reviewed as below today 10/1 Daily contact with patient to assess and evaluate symptoms and progress in treatment, Medication management and Plan :  Encourage group and milieu participation to work on coping skills and symptom reduction Treatment team working on disposition planning  Continue Abilify 5 mg daily for mood disorder , antidepressant augmentation  Continue Effexor XR  75 mg daily for depression and anxiety  Continue Trazodone 100 mg qhs PRN for Insomnia Continue Vistaril  50 mg tid  prn for anxiety  Encourage group  attendance/participation  Will monitor deltoid area erythema- manage with local measures such as warm compress . Patient agrees to inform staff if any worsening .   Jenne Campus, MD 10/01/2016, 2:06 PM   Patient ID: Jordan Gibbs, female   DOB: [birth year], 31 y.o.   MRN: [id number]

## 2016-10-01 NOTE — Progress Notes (Signed)
Patient ID: Jordan Gibbs, female   DOB: December 03, 1985, 31 y.o.   MRN: [id number]  DAR: Pt. Denies SI/HI and A/V Hallucinations. She reports sleep is good, appetite is good, energy level is normal, and concentration is good. She rates depression, hopelessness, and anxiety 0/10. Patient does report some soreness in her bilateral deltoid muscles from IM injections she received previously. She also reports a headache. She received PRN medication for pain and heat packs. Patient reports red-splotchy areas, only on bilateral upper arms near injection sites. MD Cobos was notified. She also received PRN Milk of Magnesia for constipation. Patient does not report any results as of yet. Support and encouragement provided to the patient. Patient is receptive and cooperative. She is seen in the milieu and is seen speaking on the phone intermittently throughout the day. She reports that she will continue taking her medications and attending groups. She reports that her wife is her main support at this time. Q15 minute checks are maintained for safety.

## 2016-10-01 NOTE — Plan of Care (Signed)
Problem: Self-Concept: Goal: Ability to verbalize positive feelings about self will improve Outcome: Progressing Pt stated she felt better this evening

## 2016-10-01 NOTE — Progress Notes (Signed)
Nursing Progress Note 1900-0730  D) Patient presents mildly anxious but is calm, pleasant and cooperative. Patient continues to report constipation despite Milk of Magnesia. Patient denies abdominal pain at this time. Patient reports sore, splotchy red areas to deltoids bilaterally. No changes since previous shift; MD aware. Patient observed up in the milieu this evening. Patient attended group. Patient denies SI/HI/AVH or pain. Patient contracts for safety on the unit. Patient reports sleeping well with current regimen. Patient compliant with medications. Patient inquiring about Effexor side effects stating, "I feel like I've had a decreased appetite, but I'm not complaining". Patient reports she is still eating/drinking at meal times. Patient temperature monitored and is 99.1. Patient without complaints. Patient requests trazodone and vistaril for sleep.  A) Emotional support given. 1:1 interaction and active listening provided. Patient medicated as prescribed. Medications and plan of care reviewed with patient. Patient verbalized understanding without further questions. Snacks and fluids provided. Opportunities for questions or concerns presented to patient. Patient encouraged to continue to work on treatment goals. Labs, vital signs and patient behavior monitored throughout shift. Patient safety maintained with q15 min safety checks. Low fall risk precautions in place and reviewed with patient; patient verbalized understanding.  R) Patient receptive to interaction with nurse. Patient remains safe on the unit at this time. Patient denies any adverse medication reactions at this time. Patient is resting in bed without complaints. Will continue to monitor.

## 2016-10-01 NOTE — Plan of Care (Signed)
Problem: Activity: Goal: Interest or engagement in activities will improve Outcome: Progressing Patient taking medications and attending groups per plan of care. Patient verbalizes understanding and is agreeable to current plan of care.  Problem: Safety: Goal: Periods of time without injury will increase Outcome: Progressing Patient is on q15 minute safety checks and low fall risk precautions. Patient contracts for safety on the unit and remains safe at this time.

## 2016-10-01 NOTE — Progress Notes (Signed)
D: Pt denies SI/HI/AVH. Pt is pleasant and cooperative. Pt stated she felt better because she did not feel like hurting herself anymore.   A: Pt was offered support and encouragement. Pt was given scheduled medications. Pt was encourage to attend groups. Q 15 minute checks were done for safety.   R:Pt attends groups and interacts well with peers and staff. Pt is taking medication. Pt has no complaints.Pt receptive to treatment and safety maintained on unit.

## 2016-10-01 NOTE — [facility] Suicide Risk Assessment (Signed)
[facility] INPATIENT:  Family/Significant Other Suicide Prevention Education  Suicide Prevention Education:  Education Completed; Jordan Gibbs, wife, [phone],  (name of family member/significant other) has been identified by the patient as the family member/significant other with whom the patient will be residing, and identified as the person(s) who will aid the patient in the event of a mental health crisis (suicidal ideations/suicide attempt).  With written consent from the patient, the family member/significant other has been provided the following suicide prevention education, prior to the and/or following the discharge of the patient.  The suicide prevention education provided includes the following:  Suicide risk factors  Suicide prevention and interventions  National Suicide Hotline telephone number  [facility] assessment telephone number  [facility] Emergency Assistance 911  [facility] and/or Residential Mobile Crisis Unit telephone number  Request made of family/significant other to:  Remove weapons (e.g., guns, rifles, knives), all items previously/currently identified as safety concern.    Remove drugs/medications (over-the-counter, prescriptions, illicit drugs), all items previously/currently identified as a safety concern.  The family member/significant other verbalizes understanding of the suicide prevention education information provided.  The family member/significant other agrees to remove the items of safety concern listed above.  Wife has no concerns about patient discharging tomorrow.  "I just really think it's better that she has some place to talk w someone during the day."  Reviewed SPE and aftercare plans, wife agreeable and supportive of discharge plan.    Sallee Lange 10/01/2016, 5:58 PM

## 2016-10-01 NOTE — Progress Notes (Signed)
Recreation Therapy Notes  Date: 10/01/16 Time: 0930 Location: 300 Hall Dayroom  Group Topic: Stress Management  Goal Area(s) Addresses:  Patient will verbalize importance of using healthy stress management.  Patient will identify positive emotions associated with healthy stress management.   Intervention: Stress Management  Activity :  LRT introduced the stress management technique of guided imagery.  LRT read a script that allowed patients to take a mental vacation to the beach.  Patients were to follow along as LRT read script to fully engage in the technique.  Education:  Stress Management, Discharge Planning.   Education Outcome: Acknowledges edcuation/In group clarification offered/Needs additional education  Clinical Observations/Feedback: Pt did not attend group.   Caroll Rancher, LRT/CTRS         Caroll Rancher A 10/01/2016 1:38 PM

## 2016-10-01 NOTE — Social Work (Signed)
Referred to [facility], is [place] resident.  Kelee , LCSW Lead Clinical Social Worker Phone:  [phone]  

## 2016-10-01 NOTE — [facility] Suicide Risk Assessment (Signed)
[facility] INPATIENT:  Family/Significant Other Suicide Prevention Education  Suicide Prevention Education:  Contact Attempts: Ahniya Mitchum, wife, [phone], (name of family member/significant other) has been identified by the patient as the family member/significant other with whom the patient will be residing, and identified as the person(s) who will aid the patient in the event of a mental health crisis.  With written consent from the patient, two attempts were made to provide suicide prevention education, prior to and/or following the patient's discharge.  We were unsuccessful in providing suicide prevention education.  A suicide education pamphlet was given to the patient to share with family/significant other.  Date and time of first attempt: 10/1 at 3 PM  Sallee Lange 10/01/2016, 2:58 PM

## 2016-10-01 NOTE — Progress Notes (Signed)
Adult Psychoeducational Group Note  Date:  10/01/2016 Time:  9:22 PM  Group Topic/Focus:  Wrap-Up Group:   The focus of this group is to help patients review their daily goal of treatment and discuss progress on daily workbooks.  Participation Level:  Active  Participation Quality:  Appropriate  Affect:  Appropriate  Cognitive:  Appropriate  Insight: Appropriate  Engagement in Group:  Engaged  Modes of Intervention:  Discussion  Additional Comments:  Pt stated her goal for today was to talk with doctor about her discharge plan. Pt stated she accomplished her goal today. Pt rated her day a 10. Pt stated she attended all groups held today.  [name] 10/01/2016, 9:22 PM

## 2016-10-01 NOTE — [facility] Group Notes (Signed)
LCSW Group Therapy Note   10/01/2016 1:15pm   Type of Therapy and Topic:  Group Therapy:  Overcoming Obstacles   Participation Level:  Active   Description of Group:    In this group patients will be encouraged to explore what they see as obstacles to their own wellness and recovery. They will be guided to discuss their thoughts, feelings, and behaviors related to these obstacles. The group will process together ways to cope with barriers, with attention given to specific choices patients can make. Each patient will be challenged to identify changes they are motivated to make in order to overcome their obstacles. This group will be process-oriented, with patients participating in exploration of their own experiences as well as giving and receiving support and challenge from other group members.   Therapeutic Goals: 1. Patient will identify personal and current obstacles as they relate to admission. 2. Patient will identify barriers that currently interfere with their wellness or overcoming obstacles.  3. Patient will identify feelings, thought process and behaviors related to these barriers. 4. Patient will identify two changes they are willing to make to overcome these obstacles:      Summary of Patient Progress  Patient identified w other patients who described feeling "stuck, closed in, confined" during current hospitalization.  Identified conflictual relationship w spouse as part of her obstacle - spouse is recently returning to work, patient has to learn new ways to cope w anxiety and need for attention.  Identified "breathing exercises" as way she copes w stress.     Therapeutic Modalities:   Cognitive Behavioral Therapy Solution Focused Therapy Motivational Interviewing Relapse Prevention Therapy  Sallee Lange, LCSW 10/01/2016 4:49 PM

## 2016-10-02 MED ORDER — HYDROXYZINE HCL 50 MG PO TABS: 50.0000 mg | ORAL_TABLET | Freq: Three times a day (TID) | ORAL | 0 refills | Status: DC | PRN

## 2016-10-02 MED ORDER — TRAZODONE HCL 100 MG PO TABS: 100.0000 mg | ORAL_TABLET | Freq: Every evening | ORAL | 0 refills | Status: DC | PRN

## 2016-10-02 MED ORDER — SUMATRIPTAN SUCCINATE 50 MG PO TABS: 50.0000 mg | ORAL_TABLET | ORAL | 0 refills | Status: DC | PRN

## 2016-10-02 MED ORDER — BISACODYL 5 MG PO TBEC
5.0000 mg | DELAYED_RELEASE_TABLET | Freq: Every day | ORAL | Status: DC | PRN
  Administered 2016-10-02: 5 mg via ORAL
  Filled 2016-10-02: qty 1

## 2016-10-02 MED ORDER — ARIPIPRAZOLE 5 MG PO TABS: 5.0000 mg | ORAL_TABLET | Freq: Every day | ORAL | 0 refills | Status: DC

## 2016-10-02 MED ORDER — VENLAFAXINE HCL ER 75 MG PO CP24: 75.0000 mg | ORAL_CAPSULE | Freq: Every day | ORAL | 0 refills | Status: DC

## 2016-10-02 NOTE — Progress Notes (Signed)
  [facility] Adult Case Management Discharge Plan :  Will you be returning to the same living situation after discharge:  Yes,  Pt returning home At discharge, do you have transportation home?: Yes,  Pt provided with taxi voucher Do you have the ability to pay for your medications: Yes,  Pt provided with prescriptions  Release of information consent forms completed and in the chart;  Patient's signature needed at discharge.  Patient to Follow up at: Follow-up Information    [facility] up on 10/03/2016.   Specialty:  Family Medicine Why:  at 2:00pm for your initial assessment for services. Medication management appointment will follow at 4:45pm. Contact information: [address] [place] [address] [phone]           Next level of care provider has access to [facility]:no  Safety Planning and Suicide Prevention discussed: Yes,  with wife; see SPE note  Have you used any form of tobacco in the last 30 days? (Cigarettes, Smokeless Tobacco, Cigars, and/or Pipes): Yes  Has patient been referred to the Quitline?: Patient refused referral  Patient has been referred for addiction treatment: Yes  Verdene Lennert, LCSW 10/02/2016, 1:15 PM
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
## 2016-10-02 NOTE — Progress Notes (Signed)
Recreation Therapy Notes  Animal-Assisted Activity (AAA) Program Checklist/Progress Notes Patient Eligibility Criteria Checklist & Daily Group note for Rec TxIntervention  Date: 10.02.2018 Time: 11:20am Location: [address]   AAA/T Program Assumption of Risk Form signed by Patient/ or Parent Legal Guardian Yes  Patient is free of allergies or sever asthma Yes  Patient reports no fear of animals Yes  Patient reports no history of cruelty to animals Yes  Patient understands his/her participation is voluntary Yes  Behavioral Response: Did not attend.    Marykay Lex Sander Speckman, LRT/CTRS         Deeanna Beightol L 10/02/2016 2:27 PM

## 2016-10-02 NOTE — Progress Notes (Signed)
Patient ID: Jordan Gibbs, female   DOB: [birth year], 31 y.o.   MRN: [id number]  DAR: Pt. Denies SI/HI and A/V Hallucinations. She reports sleep is poor, appetite is fair, energy level is normal, and concentration is good. She rates depression, hopelessness, and anxiety level 0/10. Patient does not report any pain or discomfort at this time. Support and encouragement provided to the patient. Patient is seen in the dayroom this morning. She appears in good spirits. She reports that she is ready for discharge soon. Q15 minute checks are maintained for safety.

## 2016-10-02 NOTE — Discharge Instructions (Signed)
Please follow-up with your Primary Care provider, urgent care, or the nearest emergency room if your rash persists and/or worsens. Seek emergency medical treatment if you feel difficulty with breathing.

## 2016-10-02 NOTE — Progress Notes (Signed)
Patient ID: Jordan Gibbs, female   DOB: 1985-01-09, 31 y.o.   MRN: [id number]  Discharge Note- Belongings returned to patient at time of discharge. Patient denies any pain or discomfort. Discharge instructions and medications were reviewed with patient. Patient verbalized understanding of both medications and discharge instructions. Patient discharged to lobby with taxi voucher. Q15 minute safety checks maintained until time of discharge. No distress noted upon discharge.

## 2016-10-02 NOTE — [facility] Suicide Risk Assessment (Signed)
[facility] Discharge Suicide Risk Assessment   Principal Problem: Bipolar affective disorder, current episode severe [facility]) Discharge Diagnoses:  Patient Active Problem List   Diagnosis Date Noted  . Bipolar affective disorder, current episode severe (HCC) [F31.9] 09/28/2016    Total Time spent with patient: 30 minutes  Musculoskeletal: Strength & Muscle Tone: within normal limits Gait & Station: normal Patient leans: N/A  Psychiatric Specialty Exam: ROS no headache, no chest pain, no dyspnea, no vomiting, no fever, no chills , reports deltoid muscle pain has subsided,   Blood pressure 138/77, pulse 86, temperature 98.9 F (37.2 C), temperature source Oral, resp. rate 20, height  (1.676 m), weight 124.3 kg (274 lb), last menstrual period 09/26/2016, SpO2 99 %.Body mass index is 44.22 kg/m.  General Appearance: Well Groomed  Eye Contact::  Good  Speech:  Normal Rate409  Volume:  Normal  Mood:  reports improved mood, denies depression  Affect:  Appropriate  Thought Process:  Linear and Descriptions of Associations: Intact  Orientation:  Full (Time, Place, and Person)  Thought Content:  no hallucinations, no delusions, not internally preoccupied   Suicidal Thoughts:  No denies any suicidal or self injurious ideations, no homicidal or violent ideations   Homicidal Thoughts:  No  Memory:  recent and remote grossly intact   Judgement:  Other:  improved   Insight:  improved   Psychomotor Activity:  Normal  Concentration:  Good  Recall:  Good  Fund of Knowledge:Good  Language: Good  Akathisia:  Negative  Handed:  Right  AIMS (if indicated):     Assets:  Communication Skills Desire for Improvement Resilience  Sleep:  Number of Hours: 4.5  Cognition: WNL  ADL's:  Intact   Mental Status Per Nursing Assessment::   On Admission:     Demographic Factors:  31 year old female, lives with sister,father, spouse, currently not employed   Loss Factors: Family stressors    Historical Factors: History of prior psychiatric admissions, most recently 2 years ago, for depression,history of prior suicide attempts, has been diagnosed with PTSD ,MDD   Risk Reduction Factors:   Sense of responsibility to family, Living with another person, especially a relative, Positive social support and Positive coping skills or problem solving skills  Continued Clinical Symptoms:  At this time patient is alert, attentive, well related, calm, pleasant, mood is improved and currently denies depression, affect is appropriate and reactive, no thought disorder, no suicidal or self injurious ideations, no homicidal or violent ideations, no psychotic symptoms, future oriented. Behavior on unit calm and in good control. On Abilify, Effexor XR- denies current side effects. Patient has area of erythema on upper extremity deltoid area, which she states occurred after recent influenza and tetanus vaccinations. She has some associated deltoid muscle pain. Today pain has improved and has no symptoms, but erythema is still present. No fever, no chills, no systemic symptoms.    Cognitive Features That Contribute To Risk:  No gross cognitive deficits noted upon discharge. Is alert , attentive, and oriented x 3   Suicide Risk:  Mild:  Suicidal ideation of limited frequency, intensity, duration, and specificity.  There are no identifiable plans, no associated intent, mild dysphoria and related symptoms, good self-control (both objective and subjective assessment), few other risk factors, and identifiable protective factors, including available and accessible social support.  Follow-up Information    [facility].   Specialty:  Family Medicine Contact information: [address]  [place] [address] [phone]           Plan Of Care/Follow-up recommendations:  Activity:  as tolerated  Diet:  Regular Tests:  NA' Other:  See below   Patient is expressing readiness for discharge and is leaving unit in good spirits  Plans to return home  Plans to follow up as above  Patient agrees to go to ED if any worsening of deltoid area erythema or any associated systemic symptoms arise.  Craige Cotta, MD 10/02/2016, 12:32 PM

## 2016-10-26 ENCOUNTER — Emergency Department (HOSPITAL_COMMUNITY)
Admission: EM | Admit: 2016-10-26 | Discharge: 2016-10-26 | Disposition: A | Discharge status: Home / Self Care | Payer: Medicaid Other | Attending: Emergency Medicine | Admitting: Emergency Medicine

## 2016-10-26 ENCOUNTER — Encounter (HOSPITAL_COMMUNITY): Payer: Self-pay | Admitting: Emergency Medicine

## 2016-10-26 DIAGNOSIS — Z79899 Other long term (current) drug therapy: Secondary | ICD-10-CM | POA: Insufficient documentation

## 2016-10-26 DIAGNOSIS — Y658 Other specified misadventures during surgical and medical care: Secondary | ICD-10-CM | POA: Insufficient documentation

## 2016-10-26 DIAGNOSIS — R451 Restlessness and agitation: Secondary | ICD-10-CM | POA: Diagnosis not present

## 2016-10-26 DIAGNOSIS — T887XXA Unspecified adverse effect of drug or medicament, initial encounter: Secondary | ICD-10-CM | POA: Diagnosis not present

## 2016-10-26 DIAGNOSIS — T450X1A Poisoning by antiallergic and antiemetic drugs, accidental (unintentional), initial encounter: Secondary | ICD-10-CM | POA: Insufficient documentation

## 2016-10-26 DIAGNOSIS — F17210 Nicotine dependence, cigarettes, uncomplicated: Secondary | ICD-10-CM | POA: Insufficient documentation

## 2016-10-26 DIAGNOSIS — T50902A Poisoning by unspecified drugs, medicaments and biological substances, intentional self-harm, initial encounter: Secondary | ICD-10-CM

## 2016-10-26 LAB — CBC
HEMATOCRIT: 39.9 % (ref 36.0–46.0)
HEMOGLOBIN: 13.7 g/dL (ref 12.0–15.0)
MCH: 30.3 pg (ref 26.0–34.0)
MCHC: 34.3 g/dL (ref 30.0–36.0)
MCV: 88.3 fL (ref 78.0–100.0)
Platelets: 234 10*3/uL (ref 150–400)
RBC: 4.52 MIL/uL (ref 3.87–5.11)
RDW: 13.1 % (ref 11.5–15.5)
WBC: 11.7 10*3/uL — ABNORMAL HIGH (ref 4.0–10.5)

## 2016-10-26 LAB — COMPREHENSIVE METABOLIC PANEL
ALBUMIN: 4.2 g/dL (ref 3.5–5.0)
ALK PHOS: 48 U/L (ref 38–126)
ALT: 33 U/L (ref 14–54)
AST: 29 U/L (ref 15–41)
Anion gap: 10 (ref 5–15)
BUN: 14 mg/dL (ref 6–20)
CALCIUM: 9.4 mg/dL (ref 8.9–10.3)
CO2: 25 mmol/L (ref 22–32)
Chloride: 108 mmol/L (ref 101–111)
Creatinine, Ser: 0.78 mg/dL (ref 0.44–1.00)
GFR calc Af Amer: >60 mL/min (ref >60)
GFR calc non Af Amer: >60 mL/min (ref >60)
GLUCOSE: 132 mg/dL — AB (ref 65–99)
Potassium: 3.6 mmol/L (ref 3.5–5.1)
SODIUM: 143 mmol/L (ref 135–145)
Total Bilirubin: 0.4 mg/dL (ref 0.3–1.2)
Total Protein: 7.8 g/dL (ref 6.5–8.1)

## 2016-10-26 LAB — I-STAT BETA HCG BLOOD, ED (MC, WL, AP ONLY): I-stat hCG, quantitative: <5 m[IU]/mL (ref <5)

## 2016-10-26 LAB — SALICYLATE LEVEL: Salicylate Lvl: <7 mg/dL (ref 2.8–30.0)

## 2016-10-26 LAB — RAPID URINE DRUG SCREEN, HOSP PERFORMED
AMPHETAMINES: NOT DETECTED
BARBITURATES: NOT DETECTED
Benzodiazepines: NOT DETECTED
COCAINE: NOT DETECTED
Opiates: NOT DETECTED
TETRAHYDROCANNABINOL: POSITIVE — AB

## 2016-10-26 LAB — ETHANOL: Alcohol, Ethyl (B): <10 mg/dL (ref <10)

## 2016-10-26 LAB — ACETAMINOPHEN LEVEL

## 2016-10-26 MED ORDER — SODIUM CHLORIDE 0.9 % IV BOLUS (SEPSIS)
1000.0000 mL | Freq: Once | INTRAVENOUS | Status: AC
  Administered 2016-10-26: 1000 mL via INTRAVENOUS

## 2016-10-26 NOTE — ED Notes (Signed)
Pt refused being dressed out in a gown. 
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
## 2016-10-26 NOTE — Discharge Instructions (Signed)
Follow-up with your doctor if any problems.  Only take the prescribed amount of any of your medicines

## 2016-10-26 NOTE — ED Notes (Signed)
Patient is trying to leave. MD states no she can not leave. She is being IVC'd

## 2016-10-26 NOTE — ED Notes (Signed)
Bed: [facility] Expected date:  Expected time:  Means of arrival:  Comments: 
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
## 2017-11-20 ENCOUNTER — Emergency Department (HOSPITAL_COMMUNITY)
Admission: EM | Admit: 2017-11-20 | Discharge: 2017-11-20 | Disposition: A | Discharge status: Home / Self Care | Payer: Medicaid Other | Attending: Emergency Medicine | Admitting: Emergency Medicine

## 2017-11-20 ENCOUNTER — Encounter (HOSPITAL_COMMUNITY): Payer: Self-pay | Admitting: Emergency Medicine

## 2017-11-20 DIAGNOSIS — R42 Dizziness and giddiness: Secondary | ICD-10-CM | POA: Diagnosis not present

## 2017-11-20 DIAGNOSIS — Z79899 Other long term (current) drug therapy: Secondary | ICD-10-CM | POA: Diagnosis not present

## 2017-11-20 DIAGNOSIS — F17200 Nicotine dependence, unspecified, uncomplicated: Secondary | ICD-10-CM | POA: Diagnosis not present

## 2017-11-20 DIAGNOSIS — F319 Bipolar disorder, unspecified: Secondary | ICD-10-CM | POA: Insufficient documentation

## 2017-11-20 DIAGNOSIS — K0889 Other specified disorders of teeth and supporting structures: Secondary | ICD-10-CM | POA: Diagnosis present

## 2017-11-20 LAB — I-STAT CHEM 8, ED
BUN: 12 mg/dL (ref 6–20)
CREATININE: 0.7 mg/dL (ref 0.44–1.00)
Calcium, Ion: 1.13 mmol/L — ABNORMAL LOW (ref 1.15–1.40)
Chloride: 104 mmol/L (ref 98–111)
GLUCOSE: 110 mg/dL — AB (ref 70–99)
HEMATOCRIT: 38 % (ref 36.0–46.0)
Hemoglobin: 12.9 g/dL (ref 12.0–15.0)
Potassium: 3.8 mmol/L (ref 3.5–5.1)
Sodium: 140 mmol/L (ref 135–145)
TCO2: 29 mmol/L (ref 22–32)

## 2017-11-20 LAB — I-STAT BETA HCG BLOOD, ED (MC, WL, AP ONLY): I-stat hCG, quantitative: <5 m[IU]/mL (ref <5)

## 2017-11-20 MED ORDER — NAPROXEN 375 MG PO TABS: 375.0000 mg | ORAL_TABLET | Freq: Two times a day (BID) | ORAL | 0 refills | Status: DC

## 2017-11-20 MED ORDER — MECLIZINE HCL 12.5 MG PO TABS: ORAL_TABLET | ORAL | 0 refills | Status: DC

## 2017-11-20 MED ORDER — MECLIZINE HCL 25 MG PO TABS
25.0000 mg | ORAL_TABLET | Freq: Once | ORAL | Status: AC
  Administered 2017-11-20: 25 mg via ORAL
  Filled 2017-11-20: qty 1

## 2017-11-20 MED ORDER — AMOXICILLIN-POT CLAVULANATE 875-125 MG PO TABS: 1.0000 | ORAL_TABLET | Freq: Two times a day (BID) | ORAL | 0 refills | Status: DC

## 2017-11-20 NOTE — ED Provider Notes (Signed)
[facility]   CSN: [id number] Arrival date & time: 11/20/17  2142     History   Chief Complaint Chief Complaint  Patient presents with  . Dental Pain  . Dizziness    HPI Jordan Gibbs is a 32 y.o. female with a hx of tobacco abuse, depression, and bipolar disorder who presents to the ED with complaints of dental pain which is chronic and has acutely worsened over the past 1 week as well as intermittent vertigo for the past few days. Patient relays she has chronic intermittent infections to the R lower 2nd molar which have previously required abx. She states she will occasionally get abscesses in this area and will drain them herself by applying pressure at home with expression of purulence/blood- has done this recently w/ onset of pain. She states that current discomfort is an 8/10 in severity- tried motrin/tylenol at home without relief. She has a dentist appointment in 3 weeks. She reports she feels the pain has triggered her vertigo which she has a hx of and this feels the same. She relays intermittent dizziness like the room spinning with quick head position changes, brief in duration, resolved with remaining still. Reports associated nausea with this which has lead to 3 episodes of emesis. States meclizine has helped in the past with this, but does not have active prescription. Denies fever, chills, abdominal pain, headache, change in vision, numbness, weakness, difficulty with ambulation, swelling beneath the tongue, or trouble swallowing.   HPI  Past Medical History:  Diagnosis Date  . Depression     Patient Active Problem List   Diagnosis Date Noted  . Bipolar affective disorder, current episode severe (HCC) 09/28/2016    History reviewed. No pertinent surgical history.   OB History   None      Home Medications    Prior to Admission medications   Medication Sig Start Date End Date Taking? Authorizing Provider    acetaminophen (TYLENOL) 500 MG tablet Take 1,000 mg by mouth every 6 (six) hours as needed for moderate pain or headache.    [provider]  ARIPiprazole (ABILIFY) 5 MG tablet Take 1 tablet (5 mg total) by mouth daily. For mood control 10/03/16   Money, Feliz Beamravis B, FNP  hydrOXYzine (ATARAX/VISTARIL) 50 MG tablet Take 1 tablet (50 mg total) by mouth 3 (three) times daily as needed for itching or anxiety. 10/02/16   Money, Gerlene Burdockravis B, FNP  SUMAtriptan (IMITREX) 50 MG tablet Take 1 tablet (50 mg total) by mouth every 2 (two) hours as needed for migraine or headache. May repeat in 2 hours if headache persists or recurs. 10/02/16   Money, Gerlene Burdockravis B, FNP  traZODone (DESYREL) 100 MG tablet Take 1 tablet (100 mg total) by mouth at bedtime as needed for sleep. 10/02/16   Money, Gerlene Burdockravis B, FNP  venlafaxine XR (EFFEXOR-XR) 75 MG 24 hr capsule Take 1 capsule (75 mg total) by mouth daily with breakfast. For mood control 10/03/16   Money, Gerlene Burdockravis B, FNP    Family History No family history on file.  Social History Social History   Tobacco Use  . Smoking status: Current Every Day Smoker    Packs/day: 0.25  . Smokeless tobacco: Never Used  Substance Use Topics  . Alcohol use: No  . Drug use: No     Allergies   Patient has no known allergies.   Review of Systems Review of Systems  Constitutional: Negative for chills and fever.  Respiratory:  Negative for shortness of breath.   Cardiovascular: Negative for chest pain.  Gastrointestinal: Positive for nausea and vomiting. Negative for abdominal pain, blood in stool, constipation and diarrhea.  Genitourinary: Negative for dysuria and hematuria.  Neurological: Positive for dizziness (intermittent). Negative for seizures, syncope, facial asymmetry, weakness, light-headedness, numbness and headaches.  All other systems reviewed and are negative.    Physical Exam Updated Vital Signs BP 109/80 (BP Location: Right Arm)   Pulse 76   Temp 98.4 F (36.9  C) (Oral)   Resp 16   Ht 5\' 6"  (1.676 m)   Wt 131.5 kg   SpO2 99%   BMI 46.81 kg/m   Physical Exam  Constitutional: She appears well-developed and well-nourished.  Non-toxic appearance. No distress.  HENT:  Head: Normocephalic and atraumatic.  Right Ear: Tympanic membrane normal. Tympanic membrane is not perforated, not erythematous, not retracted and not bulging.  Left Ear: Tympanic membrane normal. Tympanic membrane is not perforated, not erythematous, not retracted and not bulging.  Nose: Nose normal.  Mouth/Throat: Uvula is midline and oropharynx is clear and moist. No uvula swelling. No oropharyngeal exudate, posterior oropharyngeal erythema or tonsillar abscesses.    Poor dentition with multiple caries throughout. Right lower gingiva is erythematous and mildly swollen internally and externally. No palpable fluctuance. No gross abscess.  Posterior oropharynx is symmetric appearing. Patient tolerating own secretions without difficulty. No trismus. No drooling. No hot potato voice. No swelling beneath the tongue, submandibular compartment is soft.    Eyes: Pupils are equal, round, and reactive to light. Conjunctivae and EOM are normal. Right eye exhibits no discharge. Left eye exhibits no discharge. Right eye exhibits no nystagmus. Left eye exhibits no nystagmus.  Neck: Normal range of motion. Neck supple. No neck rigidity. No edema and no erythema present.  Cardiovascular: Normal rate and regular rhythm.  Pulmonary/Chest: Effort normal and breath sounds normal. No respiratory distress. She has no wheezes. She has no rhonchi. She has no rales.  Respiration even and unlabored  Abdominal: Soft. She exhibits no distension. There is no tenderness. There is no rigidity, no rebound and no guarding.  Lymphadenopathy:    She has no cervical adenopathy.  Neurological:  Alert. Clear speech. No facial droop. CNIII-XII grossly intact. Bilateral upper and lower extremities' sensation grossly  intact. 5/5 symmetric strength with grip strength and with plantar and dorsi flexion bilaterally. Normal finger to nose bilaterally. Negative pronator drift. Negative Romberg sign. Gait is steady and intact.   Skin: Skin is warm and dry. No rash noted.  Psychiatric: She has a normal mood and affect. Her behavior is normal. Thought content normal.  Nursing note and vitals reviewed.    ED Treatments / Results  Labs (all labs ordered are listed, but only abnormal results are displayed) Labs Reviewed  I-STAT CHEM 8, ED - Abnormal; Notable for the following components:      Result Value   Glucose, Bld 110 (*)    Calcium, Ion 1.13 (*)    All other components within normal limits  I-STAT BETA HCG BLOOD, ED ([facility], [facility], [facility] ONLY)    EKG None  Radiology No results found.  Procedures Procedures (including critical care time)  Medications Ordered in ED Medications  meclizine (ANTIVERT) tablet 25 mg (25 mg Oral Given 11/20/17 2244)     Initial Impression / Assessment and Plan / ED Course  I have reviewed the triage vital signs and the nursing notes.  Pertinent labs & imaging results that were available during my  care of the patient were reviewed by me and considered in my medical decision making (see chart for details).   Patient presents to the ED with complaints of dental pain as well as dizziness w/ associated N/V which she attributes to vertigo. Patient nontoxic appearing, in no apparent distress, vitals WNL.   - Regarding dental pain: No gross abscess on exam requiring I&D.  Exam unconcerning for Ludwig's angina or spread of infection.  Will treat with Augmentin and Naproxen.  Urged patient to follow-up with dentist, dental resources were provided to facilitate closer follow up.  - Regarding dizziness: History seems consistent with peripheral vertigo, patient has hx of similar- no focal neuro deficits to suggest CVA or other central cause of vertigo. Given episodes of emesis I-stat chem  8 obtained without significant electrolyte disturbance, pregnancy test negative, will discharge home with meclizine, information regarding Epley maneuvers provided to trial at home, and PCP follow up.   I discussed results, treatment plan, need for follow-up, and return precautions with the patient and her wife at bedside. Provided opportunity for questions, patient and her wife confirmed understanding and are in agreement with plan.   Final Clinical Impressions(s) / ED Diagnoses   Final diagnoses:  Pain, dental  Vertigo    ED Discharge Orders         Ordered    meclizine (ANTIVERT) 12.5 MG tablet     11/20/17 2317    amoxicillin-clavulanate (AUGMENTIN) 875-125 MG tablet  Every 12 hours     11/20/17 2317    naproxen (NAPROSYN) 375 MG tablet  2 times daily     11/20/17 [address], PA-C 11/20/17 2352    Azalia Bilis, MD 11/21/17 0031

## 2017-11-20 NOTE — ED Triage Notes (Signed)
Pt reports she has had an infection in her mouth for over a year which has now turned into an abscess and causing pain.  Pt states "Im not sure but I think my dental pain has triggered my vertigo."  Pt states she does not have any medication for the vertigo.

## 2017-11-20 NOTE — Discharge Instructions (Signed)
Call one of the dentists offices provided to schedule an appointment for re-evaluation and further management within the next 48 hours.   I have prescribed you Augmentin which is an antibiotic to treat the infection and Naproxen which is an anti-inflammatory medicine to treat the pain.   Please take all of your antibiotics until finished. You may develop abdominal discomfort or diarrhea from the antibiotic.  You may help offset this with probiotics which you can buy at the store (ask your pharmacist if unable to find) or get probiotics in the form of eating yogurt. Do not eat or take the probiotics until 2 hours after your antibiotic. If you are unable to tolerate these side effects follow-up with your primary care provider or return to the emergency department.   If you begin to experience any blistering, rashes, swelling, or difficulty breathing seek medical care for evaluation of potentially more serious side effects.   Be sure to eat something when taking the Naproxen as it can cause stomach upset and at worst stomach bleeding. Do not take additional non steroidal anti-inflammatory medicines such as Ibuprofen, Aleve, Advil, Mobic, Diclofenac, or goodie powder while taking Naproxen. You may supplement with Tylenol.   Regarding your dizziness we have given you a prescription for meclizine- please take 1-2 tablets every 8 hours as needed for dizziness. You may also trial the Epley maneuver to help with your vertigo, please see attached hand out.   We have prescribed you new medication(s) today. Discuss the medications prescribed today with your pharmacist as they can have adverse effects and interactions with your other medicines including over the counter and prescribed medications. Seek medical evaluation if you start to experience new or abnormal symptoms after taking one of these medicines, seek care immediately if you start to experience difficulty breathing, feeling of your throat closing, facial  swelling, or rash as these could be indications of a more serious allergic reaction  Please follow up with a dentist within 48 hours. Please follow up with your primary care within 1 week regarding dizziness- if you do not have a primary care you may follow up with our [place] [facility]. If you start to experience and new or worsening symptoms return to the emergency department. If you start to experience fever, chills, neck stiffness/pain, or inability to move your neck or open your mouth come back to the emergency department immediately.

## 2017-11-20 NOTE — ED Notes (Signed)
Attempted to get blood from pt.

## 2017-12-02 ENCOUNTER — Ambulatory Visit: Payer: Medicaid Other | Admitting: Podiatry

## 2018-05-16 ENCOUNTER — Ambulatory Visit (HOSPITAL_COMMUNITY)
Admission: EM | Admit: 2018-05-16 | Discharge: 2018-05-16 | Disposition: A | Discharge status: Home / Self Care | Payer: Medicaid Other | Attending: Family Medicine | Admitting: Family Medicine

## 2018-05-16 ENCOUNTER — Other Ambulatory Visit: Payer: Self-pay

## 2018-05-16 ENCOUNTER — Ambulatory Visit (INDEPENDENT_AMBULATORY_CARE_PROVIDER_SITE_OTHER): Payer: Medicaid Other

## 2018-05-16 ENCOUNTER — Encounter (HOSPITAL_COMMUNITY): Payer: Self-pay

## 2018-05-16 DIAGNOSIS — B9789 Other viral agents as the cause of diseases classified elsewhere: Secondary | ICD-10-CM

## 2018-05-16 DIAGNOSIS — J069 Acute upper respiratory infection, unspecified: Secondary | ICD-10-CM | POA: Diagnosis not present

## 2018-05-16 DIAGNOSIS — R05 Cough: Secondary | ICD-10-CM | POA: Diagnosis not present

## 2018-05-16 MED ORDER — BENZONATATE 200 MG PO CAPS: 200.0000 mg | ORAL_CAPSULE | Freq: Two times a day (BID) | ORAL | 0 refills | Status: DC | PRN

## 2018-05-16 MED ORDER — DM-GUAIFENESIN ER 30-600 MG PO TB12: 1.0000 | ORAL_TABLET | Freq: Two times a day (BID) | ORAL | 0 refills | Status: AC

## 2018-05-16 NOTE — ED Triage Notes (Signed)
Pt c/o productive cough, sore throat and a rash to her upper chest x3 days; denies fever

## 2018-05-16 NOTE — ED Provider Notes (Signed)
[facility]    CSN: [id number] Arrival date & time: 05/16/18  1242     History   Chief [id number] Chief Complaint  Patient presents with  . Cough    HPI Williams CheMichelle Mcelmurry is a 33 y.o. female.   HPI   Patient is here for cough.  She has had it for 3 days.  She has a lot of congestion in her chest and states she is coughing up sputum.  She states she is having bad coughing spells where she coughs and coughs repeatedly.  No chest pain.  No shortness of breath.  No sweats chills or fever.  She is not diligent about preventing COVID-19.  She rides public transportation without a mask.  No known exposures.  No nausea vomiting diarrhea.  No body aches.  Underlying lung disease asthma or emphysema  Past Medical History:  Diagnosis Date  . Depression     Patient Active Problem List   Diagnosis Date Noted  . Bipolar affective disorder, current episode severe (HCC) 09/28/2016    History reviewed. No pertinent surgical history.  OB History   No obstetric history on file.      Home Medications    Prior to Admission medications   Medication Sig Start Date End Date Taking? Authorizing Provider  benzonatate (TESSALON) 200 MG capsule Take 1 capsule (200 mg total) by mouth 2 (two) times daily as needed for cough. 05/16/18   Eustace MooreNelson, Skylen Spiering Sue, MD  dextromethorphan-guaiFENesin [facility](MUCINEX DM) 30-600 MG 12hr tablet Take 1 tablet by mouth 2 (two) times daily. 05/16/18   Eustace MooreNelson, Frank Novelo Sue, MD    Family History No family history on file.  Social History Social History   Tobacco Use  . Smoking status: Current Every Day Smoker    Packs/day: 0.25  . Smokeless tobacco: Never Used  Substance Use Topics  . Alcohol use: No  . Drug use: No     Allergies   Patient has no known allergies.   Review of Systems Review of Systems  Constitutional: Negative for chills and fever.  HENT: Negative for ear pain and sore throat.   Eyes: Negative for pain and visual disturbance.   Respiratory: Positive for cough. Negative for shortness of breath.   Cardiovascular: Negative for chest pain and palpitations.  Gastrointestinal: Negative for abdominal pain and vomiting.  Genitourinary: Negative for dysuria and hematuria.  Musculoskeletal: Negative for arthralgias and back pain.  Skin: Negative for color change and rash.  Neurological: Negative for seizures and syncope.  All other systems reviewed and are negative.    Physical Exam Triage Vital Signs ED Triage Vitals  Enc Vitals Group     BP 05/16/18 1327 94/62     Pulse Rate 05/16/18 1327 79     Resp 05/16/18 1327 18     Temp 05/16/18 1327 98.7 F (37.1 C)     Temp Source 05/16/18 1327 Oral     SpO2 05/16/18 1327 97 %     Weight --      Height --      Head Circumference --      Peak Flow --      Pain Score 05/16/18 1328 5     Pain Loc --      Pain Edu? --      Excl. in GC? --    No data found.  Updated Vital Signs BP 94/62 (BP Location: Left Arm)   Pulse 79   Temp 98.7 F (37.1 C) (Oral)  Resp 18   LMP 05/10/2018   SpO2 97%     Physical Exam Constitutional:      General: She is not in acute distress.    Appearance: She is well-developed. She is obese.  HENT:     Head: Normocephalic and atraumatic.     Right Ear: Tympanic membrane normal.     Left Ear: Tympanic membrane and ear canal normal.     Nose: Nose normal. No congestion.     Mouth/Throat:     Mouth: Mucous membranes are moist.     Pharynx: No posterior oropharyngeal erythema.  Eyes:     Conjunctiva/sclera: Conjunctivae normal.     Pupils: Pupils are equal, round, and reactive to light.  Neck:     Musculoskeletal: Normal range of motion.  Cardiovascular:     Rate and Rhythm: Normal rate and regular rhythm.     Heart sounds: Normal heart sounds.  Pulmonary:     Effort: Pulmonary effort is normal. No respiratory distress.     Breath sounds: Rhonchi present.     Comments: Rhonchi throughout both bases Abdominal:     General:  There is no distension.     Palpations: Abdomen is soft.     Tenderness: There is no abdominal tenderness.  Musculoskeletal: Normal range of motion.  Lymphadenopathy:     Cervical: No cervical adenopathy.  Skin:    General: Skin is warm and dry.  Neurological:     Mental Status: She is alert.  Psychiatric:        Mood and Affect: Mood normal.        Behavior: Behavior normal.      UC Treatments / Results  Labs (all labs ordered are listed, but only abnormal results are displayed) Labs Reviewed - No data to display  EKG None  Radiology Dg Chest 2 View  Result Date: 05/16/2018 CLINICAL DATA:  Productive cough EXAM: CHEST - 2 VIEW COMPARISON:  None. FINDINGS: The heart size and mediastinal contours are within normal limits. Both lungs are clear. The visualized skeletal structures are unremarkable. IMPRESSION: No acute abnormality of the lungs. There is no focal airspace opacity. Electronically Signed   By: Lauralyn Primes M.D.   On: 05/16/2018 14:24    Procedures Procedures (including critical care time)  Medications Ordered in UC Medications - No data to display  Initial Impression / Assessment and Plan / UC Course  I have reviewed the triage vital signs and the nursing notes.  Pertinent labs & imaging results that were available during my care of the patient were reviewed by me and considered in my medical decision making (see chart for details).    Explained the patient has a viral bronchitis or chest cold.  Antibiotics will not help her get better.  If she fails to improve over the next several days, then she can call back to see about a Z-Pak.  E visits are recommended. Final Clinical Impressions(s) / UC Diagnoses   Final diagnoses:  Viral URI with cough     Discharge Instructions     You have a viral upper respiratory infection, chest cold Your symptoms are not consistent with COVID-19, however, you should still isolate at home for 7 to 14 days until your  symptoms are cleared If you get worse instead of better he should go to the emergency room especially if you develop shortness of breath I am prescribing 2 medicines for the cough.  They work best if taken together.  Mucinex DM,  plus Tessalon 1 pill every 12 hours should help you Drink plenty of fluids   ED Prescriptions    Medication Sig Dispense Auth. Provider   benzonatate (TESSALON) 200 MG capsule Take 1 capsule (200 mg total) by mouth 2 (two) times daily as needed for cough. 20 capsule Eustace Moore, MD   dextromethorphan-guaiFENesin [facility] DM) 30-600 MG 12hr tablet Take 1 tablet by mouth 2 (two) times daily. 20 tablet Eustace Moore, MD     Controlled Substance Prescriptions [place] Controlled Substance Registry consulted? Not Applicable   Eustace Moore, MD 05/16/18 1440

## 2018-05-16 NOTE — Discharge Instructions (Signed)
You have a viral upper respiratory infection, chest cold Your symptoms are not consistent with COVID-19, however, you should still isolate at home for 7 to 14 days until your symptoms are cleared If you get worse instead of better he should go to the emergency room especially if you develop shortness of breath I am prescribing 2 medicines for the cough.  They work best if taken together.  Mucinex DM, plus Tessalon 1 pill every 12 hours should help you Drink plenty of fluids

## 2019-02-20 ENCOUNTER — Other Ambulatory Visit: Payer: Self-pay

## 2019-02-20 ENCOUNTER — Emergency Department (HOSPITAL_COMMUNITY)
Admission: EM | Admit: 2019-02-20 | Discharge: 2019-02-20 | Disposition: A | Discharge status: Home / Self Care | Payer: Medicaid Other | Attending: Emergency Medicine | Admitting: Emergency Medicine

## 2019-02-20 ENCOUNTER — Encounter (HOSPITAL_COMMUNITY): Payer: Self-pay

## 2019-02-20 DIAGNOSIS — R110 Nausea: Secondary | ICD-10-CM | POA: Diagnosis not present

## 2019-02-20 DIAGNOSIS — N939 Abnormal uterine and vaginal bleeding, unspecified: Secondary | ICD-10-CM

## 2019-02-20 DIAGNOSIS — R1084 Generalized abdominal pain: Secondary | ICD-10-CM | POA: Insufficient documentation

## 2019-02-20 DIAGNOSIS — R519 Headache, unspecified: Secondary | ICD-10-CM | POA: Diagnosis not present

## 2019-02-20 DIAGNOSIS — F17210 Nicotine dependence, cigarettes, uncomplicated: Secondary | ICD-10-CM | POA: Diagnosis not present

## 2019-02-20 LAB — CBC WITH DIFFERENTIAL/PLATELET
Abs Immature Granulocytes: 0.02 10*3/uL (ref 0.00–0.07)
Basophils Absolute: 0 10*3/uL (ref 0.0–0.1)
Basophils Relative: 1 %
Eosinophils Absolute: 0.1 10*3/uL (ref 0.0–0.5)
Eosinophils Relative: 1 %
HCT: 39.2 % (ref 36.0–46.0)
Hemoglobin: 13 g/dL (ref 12.0–15.0)
Immature Granulocytes: 0 %
Lymphocytes Relative: 34 %
Lymphs Abs: 2.1 10*3/uL (ref 0.7–4.0)
MCH: 29.5 pg (ref 26.0–34.0)
MCHC: 33.2 g/dL (ref 30.0–36.0)
MCV: 88.9 fL (ref 80.0–100.0)
Monocytes Absolute: 0.4 10*3/uL (ref 0.1–1.0)
Monocytes Relative: 7 %
Neutro Abs: 3.6 10*3/uL (ref 1.7–7.7)
Neutrophils Relative %: 57 %
Platelets: 228 10*3/uL (ref 150–400)
RBC: 4.41 MIL/uL (ref 3.87–5.11)
RDW: 12 % (ref 11.5–15.5)
WBC: 6.2 10*3/uL (ref 4.0–10.5)
nRBC: 0 % (ref 0.0–0.2)

## 2019-02-20 LAB — URINALYSIS, ROUTINE W REFLEX MICROSCOPIC
Bilirubin Urine: NEGATIVE
Glucose, UA: NEGATIVE mg/dL
Ketones, ur: NEGATIVE mg/dL
Leukocytes,Ua: NEGATIVE
Nitrite: NEGATIVE
Protein, ur: NEGATIVE mg/dL
RBC / HPF: >50 RBC/hpf — ABNORMAL HIGH (ref 0–5)
Specific Gravity, Urine: 1.017 (ref 1.005–1.030)
pH: 7 (ref 5.0–8.0)

## 2019-02-20 LAB — COMPREHENSIVE METABOLIC PANEL
ALT: 30 U/L (ref 0–44)
AST: 32 U/L (ref 15–41)
Albumin: 4.2 g/dL (ref 3.5–5.0)
Alkaline Phosphatase: 41 U/L (ref 38–126)
Anion gap: 12 (ref 5–15)
BUN: 8 mg/dL (ref 6–20)
CO2: 25 mmol/L (ref 22–32)
Calcium: 9.6 mg/dL (ref 8.9–10.3)
Chloride: 100 mmol/L (ref 98–111)
Creatinine, Ser: 0.63 mg/dL (ref 0.44–1.00)
GFR calc Af Amer: >60 mL/min (ref >60)
GFR calc non Af Amer: >60 mL/min (ref >60)
Glucose, Bld: 129 mg/dL — ABNORMAL HIGH (ref 70–99)
Potassium: 3.8 mmol/L (ref 3.5–5.1)
Sodium: 137 mmol/L (ref 135–145)
Total Bilirubin: 1 mg/dL (ref 0.3–1.2)
Total Protein: 7.3 g/dL (ref 6.5–8.1)

## 2019-02-20 LAB — WET PREP, GENITAL
Clue Cells Wet Prep HPF POC: NONE SEEN
Sperm: NONE SEEN
Trich, Wet Prep: NONE SEEN
Yeast Wet Prep HPF POC: NONE SEEN

## 2019-02-20 LAB — LIPASE, BLOOD: Lipase: 31 U/L (ref 11–51)

## 2019-02-20 LAB — HIV ANTIBODY (ROUTINE TESTING W REFLEX): HIV Screen 4th Generation wRfx: NONREACTIVE

## 2019-02-20 MED ORDER — METOCLOPRAMIDE HCL 5 MG/ML IJ SOLN
10.0000 mg | Freq: Once | INTRAMUSCULAR | Status: AC
  Administered 2019-02-20: 10 mg via INTRAVENOUS
  Filled 2019-02-20: qty 2

## 2019-02-20 MED ORDER — DIPHENHYDRAMINE HCL 25 MG PO CAPS
25.0000 mg | ORAL_CAPSULE | Freq: Once | ORAL | Status: AC
  Administered 2019-02-20: 25 mg via ORAL
  Filled 2019-02-20: qty 1

## 2019-02-20 MED ORDER — ONDANSETRON 4 MG PO TBDP: 4.0000 mg | ORAL_TABLET | Freq: Three times a day (TID) | ORAL | 0 refills | Status: AC | PRN

## 2019-02-20 MED ORDER — SODIUM CHLORIDE 0.9 % IV BOLUS
1000.0000 mL | Freq: Once | INTRAVENOUS | Status: AC
  Administered 2019-02-20: 1000 mL via INTRAVENOUS

## 2019-02-20 NOTE — Discharge Instructions (Signed)
Please use Zofran as needed for nausea.  Please follow-up with [facility]  I have included that information in this printout.

## 2019-02-20 NOTE — ED Triage Notes (Signed)
Pt reports her menstrual cycle is lasting 2 weeks and she is having left sided abd pain. Pt states her menstrual cycle normal lasts about 4 days, some nausea but no vomiting.

## 2019-02-20 NOTE — ED Provider Notes (Signed)
[facility] EMERGENCY DEPARTMENT Provider Note   CSN: [id number] Arrival date & time: 02/20/19  1726     History Chief Complaint  Patient presents with  . Vaginal Bleeding    Jordan Gibbs is a 34 y.o. female.  HPI  Patient is a 34 year old female with no significant past medical history presented today for heavy menstrual bleeding that began 2 weeks ago.  She states that she normally has a very regular menstrual period that is relatively light and last for 4 days and is regular.  She denies any trauma to the area and states that she does have some left sided abdominal pain as well as sometimes severe intermittent nausea with no vomiting.  She denies any diarrhea or constipation.  States her last bowel movement was this morning.  She denies any melena or hematochezia.  She has no abdominal surgical history.  She states she is sexually active with a female partner and states that she recently discovered that her female partner are without any sex with other women.  She states that this is recent however and that she has only outside of her female partner once since the infidelity began.  She denies any known STD exposure like to be tested today.  Patient states she has not any hormonal contraceptive pill and states that she takes no medications on a daily basis and only occasionally uses Tylenol ibuprofen for headaches or muscle aches.  She denies any history of IUD placement.  Denies any concern for pregnancy denies any lightheadedness or dizziness denies any lower abdominal pain, other vaginal discharge besides blood, vaginal pain or itching.  She also endorses headaches which is been intermittent during the day more often in the afternoon or evening.  She states they are frontal and bandlike in quality.  Not necessarily severe but "bothersome".  Denies any focal weakness, fevers, chills, neck stiffness, vision changes or trauma.    Past Medical History:  Diagnosis Date    . Depression     Patient Active Problem List   Diagnosis Date Noted  . Bipolar affective disorder, current episode severe (HCC) 09/28/2016    History reviewed. No pertinent surgical history.   OB History   No obstetric history on file.     No family history on file.  Social History   Tobacco Use  . Smoking status: Current Every Day Smoker    Packs/day: 0.25  . Smokeless tobacco: Never Used  Substance Use Topics  . Alcohol use: No  . Drug use: No    Home Medications Prior to Admission medications   Medication Sig Start Date End Date Taking? Authorizing Provider  benzonatate (TESSALON) 200 MG capsule Take 1 capsule (200 mg total) by mouth 2 (two) times daily as needed for cough. 05/16/18   Eustace Moore, MD  dextromethorphan-guaiFENesin [facility] DM) 30-600 MG 12hr tablet Take 1 tablet by mouth 2 (two) times daily. 05/16/18   Eustace Moore, MD  ondansetron (ZOFRAN ODT) 4 MG disintegrating tablet Take 1 tablet (4 mg total) by mouth every 8 (eight) hours as needed for nausea or vomiting. 02/20/19   Gailen Shelter, PA    Allergies    Patient has no known allergies.  Review of Systems   Review of Systems  Constitutional: Negative for chills and fever.  HENT: Negative for congestion.   Eyes: Negative for pain.  Respiratory: Negative for cough and shortness of breath.   Cardiovascular: Negative for chest pain and leg swelling.  Gastrointestinal:  Positive for abdominal pain. Negative for vomiting.  Genitourinary: Positive for menstrual problem and vaginal bleeding. Negative for decreased urine volume, difficulty urinating, dysuria, enuresis, flank pain, frequency, genital sores, hematuria, urgency, vaginal discharge and vaginal pain.  Musculoskeletal: Negative for myalgias.  Skin: Negative for rash.  Neurological: Positive for headaches. Negative for dizziness.    Physical Exam Updated Vital Signs BP 130/81 (BP Location: Left Arm)   Pulse 80   Temp 98.2 F  (36.8 C) (Oral)   Resp 18   Ht 5\' 6"  (1.676 m)   Wt 129.3 kg   SpO2 98%   BMI 46.00 kg/m   Physical Exam Vitals and nursing note reviewed.  Constitutional:      General: She is not in acute distress. HENT:     Head: Normocephalic and atraumatic.     Nose: Nose normal.     Mouth/Throat:     Mouth: Mucous membranes are dry.  Eyes:     General: No scleral icterus. Cardiovascular:     Rate and Rhythm: Normal rate and regular rhythm.     Pulses: Normal pulses.     Heart sounds: Normal heart sounds.  Pulmonary:     Effort: Pulmonary effort is normal. No respiratory distress.     Breath sounds: No wheezing.  Abdominal:     General: Bowel sounds are normal.     Palpations: Abdomen is soft.     Tenderness: There is abdominal tenderness (Left lower quadrant tenderness with deep palpation). There is no right CVA tenderness, left CVA tenderness, guarding or rebound.  Genitourinary:    Comments: Vulva without lesions or abnormality Vaginal canal without abnormal discharge or lesion Cervix appears normal, is closed No adnexal tenderness or CMT  Unremarkable pelvic exam with no active bleeding Musculoskeletal:     Cervical back: Normal range of motion.     Right lower leg: No edema.     Left lower leg: No edema.  Skin:    General: Skin is warm and dry.     Capillary Refill: Capillary refill takes less than 2 seconds.  Neurological:     Mental Status: She is alert. Mental status is at baseline.  Psychiatric:        Mood and Affect: Mood normal.        Behavior: Behavior normal.     ED Results / Procedures / Treatments   Labs (all labs ordered are listed, but only abnormal results are displayed) Labs Reviewed  WET PREP, GENITAL - Abnormal; Notable for the following components:      Result Value   WBC, Wet Prep HPF POC FEW (*)    All other components within normal limits  COMPREHENSIVE METABOLIC PANEL - Abnormal; Notable for the following components:   Glucose, Bld 129 (*)     All other components within normal limits  URINALYSIS, ROUTINE W REFLEX MICROSCOPIC - Abnormal; Notable for the following components:   APPearance HAZY (*)    Hgb urine dipstick LARGE (*)    RBC / HPF >50 (*)    Bacteria, UA RARE (*)    All other components within normal limits  CBC WITH DIFFERENTIAL/PLATELET  LIPASE, BLOOD  RPR  HIV ANTIBODY (ROUTINE TESTING W REFLEX)  I-STAT BETA HCG BLOOD, ED ([facility], [facility], [facility] ONLY)  GC/CHLAMYDIA PROBE AMP ([place]) NOT AT [facility]    EKG EKG Interpretation  Date/Time:  Friday February 20 2019 19:32:57 EST Ventricular Rate:  78 PR Interval:    QRS Duration: 110 QT Interval:  380 QTC Calculation: 433 R Axis:   40 Text Interpretation: Sinus rhythm Low voltage, precordial leads Borderline T abnormalities, anterior leads Confirmed by Virgina Norfolk [phone]) on 02/20/2019 7:36:19 PM   Radiology No results found.  Procedures Procedures (including critical care time)  Medications Ordered in ED Medications  metoCLOPramide (REGLAN) injection 10 mg (10 mg Intravenous Given 02/20/19 1916)  sodium chloride 0.9 % bolus 1,000 mL (0 mLs Intravenous Stopped 02/20/19 2131)  diphenhydrAMINE (BENADRYL) capsule 25 mg (25 mg Oral Given 02/20/19 1908)    ED Course  I have reviewed the triage vital signs and the nursing notes.  Pertinent labs & imaging results that were available during my care of the patient were reviewed by me and considered in my medical decision making (see chart for details).  Patient has healthy, well-appearing 34 year old female with no significant past medical history presented today for left lower abdominal pain, nausea, vaginal bleeding as well as headache.  On physical exam patient has some dry appearing mucous membranes, some mild tenderness to palpation of the left lower quadrant is only evident with deep palpation.  Vital signs are within normal limits, she is afebrile and not tachycardic.  She is overall well-appearing in no acute  distress.  Because of left lower quadrant abdominal pain and nausea some concern for ectopic DS diverticulitis VS PID VS torsion VS menstrual cramping VS mittelschmerz VS vaginal infection.  Pelvic exam was unremarkable with no CMT or adnexal mass.  After fluids and antiemetics patient was tolerating fluids well and her headache abdominal pain completely resolved.  On reassessment of abdomen she has noticed that patient is tolerating fluids not difficulty.    Her blood work is largely unremarkable with no leukocytosis or anemia and no electrolyte abnormalities.  Lipase was within normal limits making pancreatitis unlikely.   Wet prep shows few WBCs with no evidence of trich or yeast or BV. As patient has no CMT I doubt PID. Discussed with patient she would prefer to avoid antibiotic therapy at this time as she feels completely better.  Beta-hCG i-STAT collected however was lost.  I discussed this mishap with patient.  Because she is only sexually active with another female she states she would prefer not to have the lab redrawn and states that there is no possibility of her being pregnant.  I discussed with patient that without a hCG we cannot fully exclude ectopic pregnancy she is understanding of this and would like to defer the test.  Her headache is resolved. Suspect tension HA possibly partially due to dehydration. Her sx resolved after fluids and reglan.    Patient on time of discharge.  I had prescribed her Zofran for nausea as needed.  Vital signs within normal limits at discharge and during entirety of ED visit.  I gave patient strict return precautions.  She will return to ED if she has any concerns otherwise she will follow up with OB/GYN   MDM Rules/Calculators/A&P                      Final Clinical Impression(s) / ED Diagnoses Final diagnoses:  Vaginal bleeding  Generalized abdominal pain    Rx / DC Orders ED Discharge Orders         Ordered    ondansetron (ZOFRAN ODT) 4 MG  disintegrating tablet  Every 8 hours PRN     02/20/19 2129           Gailen Shelter, [place] 02/22/19 1258  Virgina Norfolk, DO 02/22/19 1616

## 2019-02-20 NOTE — ED Notes (Signed)
Up to there bathroom

## 2019-02-21 LAB — RPR: RPR Ser Ql: NONREACTIVE

## 2019-02-24 LAB — GC/CHLAMYDIA PROBE AMP (~~LOC~~) NOT AT ARMC
Chlamydia: NEGATIVE
Neisseria Gonorrhea: NEGATIVE

## 2019-03-01 ENCOUNTER — Encounter (HOSPITAL_COMMUNITY): Payer: Self-pay | Admitting: Emergency Medicine

## 2019-03-01 ENCOUNTER — Other Ambulatory Visit: Payer: Self-pay

## 2019-03-01 ENCOUNTER — Ambulatory Visit (HOSPITAL_COMMUNITY)
Admission: EM | Admit: 2019-03-01 | Discharge: 2019-03-01 | Disposition: A | Discharge status: Home / Self Care | Payer: Medicaid Other | Attending: Internal Medicine | Admitting: Internal Medicine

## 2019-03-01 DIAGNOSIS — R1032 Left lower quadrant pain: Secondary | ICD-10-CM | POA: Diagnosis not present

## 2019-03-01 LAB — POCT URINALYSIS DIP (DEVICE)
Bilirubin Urine: NEGATIVE
Glucose, UA: NEGATIVE mg/dL
Ketones, ur: NEGATIVE mg/dL
Leukocytes,Ua: NEGATIVE
Nitrite: NEGATIVE
Protein, ur: NEGATIVE mg/dL
Specific Gravity, Urine: >=1.03 (ref 1.005–1.030)
Urobilinogen, UA: 0.2 mg/dL (ref 0.0–1.0)
pH: 5.5 (ref 5.0–8.0)

## 2019-03-01 NOTE — Discharge Instructions (Addendum)
I would like for you to go to the Emergency Department for further evaluation.

## 2019-03-01 NOTE — ED Provider Notes (Signed)
[facility]    CSN: [id number] Arrival date & time: 03/01/19  1404      History   Chief Complaint Chief Complaint  Patient presents with  . Abdominal Pain    HPI Bailynn Dyk is a 34 y.o. female.   Patient presents urgent care for 4 to 5 days of worsening lower abdominal pain.  She reports the pain is 8 out of 10 and primarily in her left lower abdomen.  She reports pain with both bowel movement and urination at this point.  She endorses loose stools for about 1 month.  Denies blood in her stool or dark tarry color.  She denies fever and chills.  She denies nausea and vomiting.  She has had a scant yellow discharge that started with this pain.  She reports she is sexually active with her female spouse only.  Currently denies vaginal bleeding of any kind.  He denies any  chance she is pregnant as she has only sexually active with female partner.  Reports she did have quite a bit of pain after intercourse with her female partner.  She reports the pain was caused by finger penetration and this pain was in her left lower quadrant.  She denies vaginal pain itself.  Of note she was recently in the emergency department on 02/20/2019 for prolonged heavy vaginal bleeding and abdominal pain.  She reports her current pain started 4 to 5 days after her vaginal bleeding stopped.  At that visit she had negative gonorrhea and Chlamydia test.  Urinalysis only showed blood in urine.  She was also negative for bacterial vaginosis and candidal infection.  At this visit she had severe nausea and vomiting which responded well to treatment.  She denies a history of abdominal surgeries.  No history of rest STDs or ectopic pregnancies in her past.  She has not had routine GYN care     Past Medical History:  Diagnosis Date  . Depression     Patient Active Problem List   Diagnosis Date Noted  . Bipolar affective disorder, current episode severe (HCC) 09/28/2016    History reviewed. No  pertinent surgical history.  OB History   No obstetric history on file.      Home Medications    Prior to Admission medications   Medication Sig Start Date End Date Taking? Authorizing Provider  benzonatate (TESSALON) 200 MG capsule Take 1 capsule (200 mg total) by mouth 2 (two) times daily as needed for cough. 05/16/18   Eustace Moore, MD  dextromethorphan-guaiFENesin [facility] DM) 30-600 MG 12hr tablet Take 1 tablet by mouth 2 (two) times daily. 05/16/18   Eustace Moore, MD  ondansetron (ZOFRAN ODT) 4 MG disintegrating tablet Take 1 tablet (4 mg total) by mouth every 8 (eight) hours as needed for nausea or vomiting. 02/20/19   Gailen Shelter, PA    Family History Family History  Problem Relation Age of Onset  . Diabetes Father     Social History Social History   Tobacco Use  . Smoking status: Current Every Day Smoker    Packs/day: 0.25  . Smokeless tobacco: Never Used  Substance Use Topics  . Alcohol use: Yes  . Drug use: Yes    Types: Marijuana     Allergies   Patient has no known allergies.   Review of Systems Review of Systems  Constitutional: Negative for chills, fatigue and fever.  HENT: Negative.   Eyes: Negative for pain and visual disturbance.  Respiratory: Negative  for cough and shortness of breath.   Cardiovascular: Negative for chest pain and palpitations.  Gastrointestinal: Positive for abdominal pain and diarrhea. Negative for blood in stool, constipation and vomiting.  Genitourinary: Positive for dyspareunia, dysuria and vaginal discharge. Negative for frequency, hematuria, pelvic pain, urgency, vaginal bleeding and vaginal pain.  Musculoskeletal: Negative for arthralgias, back pain and myalgias.  Skin: Negative for color change and rash.  Neurological: Negative for seizures, syncope and headaches.  All other systems reviewed and are negative.    Physical Exam Triage Vital Signs ED Triage Vitals  Enc Vitals Group     BP      Pulse       Resp      Temp      Temp src      SpO2      Weight      Height      Head Circumference      Peak Flow      Pain Score      Pain Loc      Pain Edu?      Excl. in GC?    No data found.  Updated Vital Signs BP 128/84 (BP Location: Left Arm) Comment (BP Location): large cuff  Pulse 78   Temp 98.1 F (36.7 C) (Oral)   Resp 20   LMP 02/08/2019   SpO2 97%   Visual Acuity Right Eye Distance:   Left Eye Distance:   Bilateral Distance:    Right Eye Near:   Left Eye Near:    Bilateral Near:     Physical Exam Vitals and nursing note reviewed.  Constitutional:      General: She is not in acute distress.    Appearance: She is well-developed. She is not ill-appearing.  HENT:     Head: Normocephalic and atraumatic.  Eyes:     Conjunctiva/sclera: Conjunctivae normal.  Cardiovascular:     Rate and Rhythm: Normal rate and regular rhythm.     Heart sounds: No murmur.  Pulmonary:     Effort: Pulmonary effort is normal. No respiratory distress.     Breath sounds: Normal breath sounds. No wheezing, rhonchi or rales.  Abdominal:     General: Bowel sounds are normal.     Palpations: Abdomen is soft.     Tenderness: There is abdominal tenderness in the suprapubic area and left lower quadrant. There is rebound (Right-sided rebound pain). There is no right CVA tenderness or left CVA tenderness. Negative signs include McBurney's sign.     Hernia: No hernia is present.  Genitourinary:    Comments: Deferred Musculoskeletal:     Cervical back: Neck supple.  Skin:    General: Skin is warm and dry.     Capillary Refill: Capillary refill takes less than 2 seconds.     Coloration: Skin is not jaundiced or pale.     Findings: No rash.  Neurological:     General: No focal deficit present.     Mental Status: She is alert and oriented to person, place, and time.  Psychiatric:        Mood and Affect: Mood normal.        Behavior: Behavior normal.      UC Treatments / Results    Labs (all labs ordered are listed, but only abnormal results are displayed) Labs Reviewed  POCT URINALYSIS DIP (DEVICE) - Abnormal; Notable for the following components:      Result Value   Hgb urine dipstick TRACE (*)  All other components within normal limits    EKG   Radiology No results found.  Procedures Procedures (including critical care time)  Medications Ordered in UC Medications - No data to display  Initial Impression / Assessment and Plan / UC Course  I have reviewed the triage vital signs and the nursing notes.  Pertinent labs & imaging results that were available during my care of the patient were reviewed by me and considered in my medical decision making (see chart for details).     #Left lower abdominal pain Patient is a 34 year old female presenting with acute lower abdominal pain.  Patient had work-up in the emergency department 10 days ago with no clear infectious etiology found at that time.  Her pain is much more severe now than it was at initial presentation not emergency department.  She did not have imaging in the emergency department 10 days ago.  Given 8 out of 10 worsening lower abdominal pain felt she needs imaging with CT scan or pelvic ultrasound at emergency department, as consideration for ovarian torsion vs appendicitis vs diverticulitis vs tubo-ovarian abscess.  patient was advised to go to the emergency department and she agrees with this plan   Final Clinical Impressions(s) / UC Diagnoses   Final diagnoses:  Abdominal pain, left lower quadrant     Discharge Instructions     I would like for you to go to the Emergency Department for further evaluation.     ED Prescriptions    None     PDMP not reviewed this encounter.   Purnell Shoemaker, PA-C 03/01/19 1658

## 2019-03-01 NOTE — ED Triage Notes (Addendum)
Abdominal  pain started 2 weeks ago, menstrual cycle lasted 2 weeks.  Pain isl left lower abdomen.  Denies burning with urination, but does feel pressure.  No unusual vaginal discharge.  Denies back pain.    Patient has been seen in ED, but no answer .  Patient reports being sexually active last night and this has made pain worse

## 2019-04-20 ENCOUNTER — Encounter (HOSPITAL_COMMUNITY): Payer: Self-pay

## 2019-04-20 ENCOUNTER — Ambulatory Visit (HOSPITAL_COMMUNITY)
Admission: EM | Admit: 2019-04-20 | Discharge: 2019-04-20 | Disposition: A | Discharge status: Home / Self Care | Payer: Medicaid Other | Attending: Physician Assistant | Admitting: Physician Assistant

## 2019-04-20 ENCOUNTER — Other Ambulatory Visit: Payer: Self-pay

## 2019-04-20 DIAGNOSIS — Z20822 Contact with and (suspected) exposure to covid-19: Secondary | ICD-10-CM | POA: Diagnosis not present

## 2019-04-20 DIAGNOSIS — J309 Allergic rhinitis, unspecified: Secondary | ICD-10-CM | POA: Diagnosis present

## 2019-04-20 DIAGNOSIS — F17210 Nicotine dependence, cigarettes, uncomplicated: Secondary | ICD-10-CM | POA: Diagnosis not present

## 2019-04-20 DIAGNOSIS — Z79899 Other long term (current) drug therapy: Secondary | ICD-10-CM | POA: Insufficient documentation

## 2019-04-20 DIAGNOSIS — H6503 Acute serous otitis media, bilateral: Secondary | ICD-10-CM | POA: Insufficient documentation

## 2019-04-20 DIAGNOSIS — J3089 Other allergic rhinitis: Secondary | ICD-10-CM

## 2019-04-20 MED ORDER — FLUTICASONE PROPIONATE 50 MCG/ACT NA SUSP: 1.0000 | Freq: Every day | NASAL | 2 refills | Status: AC

## 2019-04-20 MED ORDER — PSEUDOEPHEDRINE HCL 30 MG PO TABA: 30.0000 mg | ORAL_TABLET | ORAL | 0 refills | Status: DC | PRN

## 2019-04-20 MED ORDER — CETIRIZINE HCL 10 MG PO TABS: 10.0000 mg | ORAL_TABLET | Freq: Every day | ORAL | 0 refills | Status: DC

## 2019-04-20 NOTE — ED Triage Notes (Addendum)
Pt is here with bilateral ear pain & swollen lymph nodes, states she has had her tonsils removed, this started yesterday. States she is traveling to [facility] tomorrow & does not want her ears to rupture being on the plane.

## 2019-04-20 NOTE — Discharge Instructions (Signed)
Start the zyrtec today Start the [employer] the pseudoephedrine 1 tab every 4-6 hours until travel is complete  Take 2 regular strength ibuprofen prior to fliying  If your Covid-19 test is positive, you will receive a phone call from [facility] regarding your results. Negative test results are not called. Both positive and negative results area always visible on MyChart. If you do not have a MyChart account, sign up instructions are in your discharge papers.   Persons who are directed to care for themselves at home may discontinue isolation under the following conditions:   At least 10 days have passed since symptom onset and  At least 24 hours have passed without running a fever (this means without the use of fever-reducing medications) and  Other symptoms have improved.  Persons infected with COVID-19 who never develop symptoms may discontinue isolation and other precautions 10 days after the date of their first positive COVID-19 test.

## 2019-04-20 NOTE — ED Provider Notes (Signed)
[place]    CSN: [id number] Arrival date & time: 04/20/19  1236      History   Chief Complaint Chief Complaint  Patient presents with  . Otalgia  . Sore Throat    HPI Jordan Gibbs is a 34 y.o. female.   Patient reports for evaluation of nasal congestion and bilateral ear pain.  She reports yesterday she began having a runny nose with sneezing and watery eyes.  She also reports having pain in both ears.  Reports a little bit of decreased hearing. She has had a frontal headache for the last 2 days. Denies fever chills.  Denies cough, sore throat, nausea, vomiting, diarrhea.    Denies knowing whether she has had seasonal allergies before.  She reports she is flying tomorrow at 8 PM.  She is concerned about ear pain on the plane.  She reports her nephew had similar symptoms recently.     Past Medical History:  Diagnosis Date  . Depression     Patient Active Problem List   Diagnosis Date Noted  . Bipolar affective disorder, current episode severe ([place]) 09/28/2016    History reviewed. No pertinent surgical history.  OB History   No obstetric history on file.      Home Medications    Prior to Admission medications   Medication Sig Start Date End Date Taking? Authorizing Provider  benzonatate (TESSALON) 200 MG capsule Take 1 capsule (200 mg total) by mouth 2 (two) times daily as needed for cough. 05/16/18   Raylene Everts, MD  cetirizine (ZYRTEC ALLERGY) 10 MG tablet Take 1 tablet (10 mg total) by mouth daily. 04/20/19 05/20/19  Jama Mcmiller, Marguerita Beards, PA-C  dextromethorphan-guaiFENesin (MUCINEX DM) 30-600 MG 12hr tablet Take 1 tablet by mouth 2 (two) times daily. 05/16/18   Raylene Everts, MD  fluticasone [facility]) 50 MCG/ACT nasal spray Place 1 spray into both nostrils daily. 04/20/19   Kelten Enochs, Marguerita Beards, PA-C  ondansetron (ZOFRAN ODT) 4 MG disintegrating tablet Take 1 tablet (4 mg total) by mouth every 8 (eight) hours as needed for nausea or vomiting. 02/20/19    Tedd Sias, PA  Pseudoephedrine HCl, Deter, 30 MG TABA Take 30 mg by mouth every 4 (four) hours as needed. 04/20/19   Gioia Ranes, Marguerita Beards, PA-C    Family History Family History  Problem Relation Age of Onset  . Healthy Mother   . Diabetes Father     Social History Social History   Tobacco Use  . Smoking status: Current Every Day Smoker    Packs/day: 0.25  . Smokeless tobacco: Never Used  Substance Use Topics  . Alcohol use: Yes  . Drug use: Yes    Types: Marijuana     Allergies   Patient has no known allergies.   Review of Systems Review of Systems  Per HPI Physical Exam Triage Vital Signs ED Triage Vitals  Enc Vitals Group     BP 04/20/19 1325 (!) 126/95     Pulse Rate 04/20/19 1325 89     Resp 04/20/19 1325 19     Temp 04/20/19 1325 98.9 F (37.2 C)     Temp Source 04/20/19 1325 Oral     SpO2 04/20/19 1325 97 %     Weight 04/20/19 1323 285 lb (129.3 kg)     Height --      Head Circumference --      Peak Flow --      Pain Score --  Pain Loc --      Pain Edu? --      Excl. in GC? --    No data found.  Updated Vital Signs BP (!) 126/95 (BP Location: Right Arm)   Pulse 89   Temp 98.9 F (37.2 C) (Oral)   Resp 19   Wt 285 lb (129.3 kg)   LMP 04/15/2019   SpO2 97%   BMI 46.00 kg/m   Visual Acuity Right Eye Distance:   Left Eye Distance:   Bilateral Distance:    Right Eye Near:   Left Eye Near:    Bilateral Near:     Physical Exam Vitals and nursing note reviewed.  Constitutional:      General: She is not in acute distress.    Appearance: She is well-developed.  HENT:     Head: Normocephalic and atraumatic.     Right Ear: Ear canal normal. No drainage, swelling or tenderness. A middle ear effusion is present. Tympanic membrane is not erythematous.     Left Ear: Ear canal normal. No drainage, swelling or tenderness. A middle ear effusion is present. Tympanic membrane is not erythematous.     Nose: Congestion present.     Comments:  Most pale and boggy turbinates, mild erythema in places    Mouth/Throat:     Mouth: Mucous membranes are moist.     Pharynx: Oropharynx is clear.  Eyes:     Conjunctiva/sclera: Conjunctivae normal.  Cardiovascular:     Rate and Rhythm: Normal rate and regular rhythm.     Heart sounds: No murmur.  Pulmonary:     Effort: Pulmonary effort is normal. No respiratory distress.     Breath sounds: Normal breath sounds.  Musculoskeletal:     Cervical back: Neck supple.  Skin:    General: Skin is warm and dry.  Neurological:     Mental Status: She is alert.      UC Treatments / Results  Labs (all labs ordered are listed, but only abnormal results are displayed) Labs Reviewed  SARS CORONAVIRUS 2 (TAT 6-24 HRS)    EKG   Radiology No results found.  Procedures Procedures (including critical care time)  Medications Ordered in UC Medications - No data to display  Initial Impression / Assessment and Plan / UC Course  I have reviewed the triage vital signs and the nursing notes.  Pertinent labs & imaging results that were available during my care of the patient were reviewed by me and considered in my medical decision making (see chart for details).     #Allergic rhinitis #Bilateral acute serous otitis media Patient is 34 year old presenting with allergic rhinitis and secondary serous otitis.  Will start on allergy medication Zyrtec and Flonase.  Discussed a very short course of decongestant anti-inflammatory around flying.  Discussed that given headache in the presence of a cold-like symptoms Covid testing would be warranted in order to fully rule out.  Patient agrees to this plan. -Patient educated on hydration.  Final Clinical Impressions(s) / UC Diagnoses   Final diagnoses:  Allergic rhinitis due to other allergic trigger, unspecified seasonality  Bilateral acute serous otitis media, recurrence not specified     Discharge Instructions     Start the zyrtec  today Start the flonase Start the pseudoephedrine 1 tab every 4-6 hours until travel is complete  Take 2 regular strength ibuprofen prior to fliying  If your Covid-19 test is positive, you will receive a phone call from [facility] regarding your results.  Negative test results are not called. Both positive and negative results area always visible on MyChart. If you do not have a MyChart account, sign up instructions are in your discharge papers.   Persons who are directed to care for themselves at home may discontinue isolation under the following conditions:  . At least 10 days have passed since symptom onset and . At least 24 hours have passed without running a fever (this means without the use of fever-reducing medications) and . Other symptoms have improved.  Persons infected with COVID-19 who never develop symptoms may discontinue isolation and other precautions 10 days after the date of their first positive COVID-19 test.     ED Prescriptions    Medication Sig Dispense Auth. Provider   cetirizine (ZYRTEC ALLERGY) 10 MG tablet Take 1 tablet (10 mg total) by mouth daily. 30 tablet Jarret Torre, Veryl Speak, PA-C   fluticasone (FLONASE) 50 MCG/ACT nasal spray Place 1 spray into both nostrils daily. 11.1 mL Lord Lancour, Veryl Speak, PA-C   Pseudoephedrine HCl, Deter, 30 MG TABA Take 30 mg by mouth every 4 (four) hours as needed. 30 tablet Yuleimy Kretz, Veryl Speak, PA-C     PDMP not reviewed this encounter.   Hermelinda Medicus, PA-C 04/20/19 1506

## 2019-04-21 ENCOUNTER — Telehealth (HOSPITAL_COMMUNITY): Payer: Self-pay

## 2019-04-21 LAB — SARS CORONAVIRUS 2 (TAT 6-24 HRS): SARS Coronavirus 2: NEGATIVE

## 2019-06-08 ENCOUNTER — Ambulatory Visit (HOSPITAL_COMMUNITY)
Admission: EM | Admit: 2019-06-08 | Discharge: 2019-06-08 | Disposition: A | Discharge status: Home / Self Care | Payer: Medicaid Other | Attending: Family Medicine | Admitting: Family Medicine

## 2019-06-08 ENCOUNTER — Other Ambulatory Visit: Payer: Self-pay

## 2019-06-08 ENCOUNTER — Encounter (HOSPITAL_COMMUNITY): Payer: Self-pay | Admitting: Emergency Medicine

## 2019-06-08 DIAGNOSIS — L259 Unspecified contact dermatitis, unspecified cause: Secondary | ICD-10-CM

## 2019-06-08 DIAGNOSIS — R21 Rash and other nonspecific skin eruption: Secondary | ICD-10-CM | POA: Diagnosis not present

## 2019-06-08 MED ORDER — DEXAMETHASONE SODIUM PHOSPHATE 10 MG/ML IJ SOLN
INTRAMUSCULAR | Status: AC
  Filled 2019-06-08: qty 1

## 2019-06-08 MED ORDER — DEXAMETHASONE SODIUM PHOSPHATE 10 MG/ML IJ SOLN
10.0000 mg | Freq: Once | INTRAMUSCULAR | Status: AC
  Administered 2019-06-08: 10 mg via INTRAMUSCULAR

## 2019-06-08 MED ORDER — PREDNISONE 10 MG (21) PO TBPK: ORAL_TABLET | Freq: Every day | ORAL | 0 refills | Status: AC

## 2019-06-08 MED ORDER — TRIAMCINOLONE ACETONIDE 0.1 % EX CREA: 1.0000 "application " | TOPICAL_CREAM | Freq: Two times a day (BID) | CUTANEOUS | 0 refills | Status: AC

## 2019-06-08 MED ORDER — HYDROXYZINE HCL 25 MG PO TABS: 25.0000 mg | ORAL_TABLET | Freq: Four times a day (QID) | ORAL | 0 refills | Status: AC

## 2019-06-08 NOTE — ED Triage Notes (Signed)
Pt c/o possible poison ivy x 3 days. Initial reaction began on right arm and has spread to her neck and shoulders and face. Pt denies any vision problems. No SOB.

## 2019-06-08 NOTE — Discharge Instructions (Signed)
You have contact dermatitis. This is a rash that appears when your skin is exposed to an irritant.   Use the steroid cream no more than three times per day.  Follow up with your primary care provider, or to see us as needed.  Report to the emergency room for shortness of breath, high fever, severe diarrhea, or other concerning symptoms.  

## 2019-06-09 NOTE — ED Provider Notes (Signed)
[place]   [id number] 06/08/19 Arrival Time: 7026  CC: RASH  SUBJECTIVE:  Jordan Gibbs is a 34 y.o. female who presents with a skin complaint that began 3 days ago. Reports that she thinks she got into poison oak Denies precipitating event or trauma. Denies changes in soaps, detergents, close contacts with similar rash, allergy. Denies medications change or starting a new medication recently. Localizes the rash to bilateral arms, abdomen and face. Describes it as itchy and red, with no drainage. Has tried calamine lotion without relief. There are no aggravating factors. Reports similar symptoms in the past that improved with steroids.  Denies fever, chills, nausea, vomiting, erythema, swelling, discharge, oral lesions, SOB, chest pain, abdominal pain, changes in bowel or bladder function.    ROS: As per HPI.  All other pertinent ROS negative.     Past Medical History:  Diagnosis Date  . Depression    History reviewed. No pertinent surgical history. No Known Allergies No current facility-administered medications on file prior to encounter.   Current Outpatient Medications on File Prior to Encounter  Medication Sig Dispense Refill  . benzonatate (TESSALON) 200 MG capsule Take 1 capsule (200 mg total) by mouth 2 (two) times daily as needed for cough. 20 capsule 0  . cetirizine (ZYRTEC ALLERGY) 10 MG tablet Take 1 tablet (10 mg total) by mouth daily. 30 tablet 0  . dextromethorphan-guaiFENesin (MUCINEX DM) 30-600 MG 12hr tablet Take 1 tablet by mouth 2 (two) times daily. 20 tablet 0  . fluticasone (FLONASE) 50 MCG/ACT nasal spray Place 1 spray into both nostrils daily. 11.1 mL 2  . ondansetron (ZOFRAN ODT) 4 MG disintegrating tablet Take 1 tablet (4 mg total) by mouth every 8 (eight) hours as needed for nausea or vomiting. 20 tablet 0  . Pseudoephedrine HCl, Deter, 30 MG TABA Take 30 mg by mouth every 4 (four) hours as needed. 30 tablet 0   Social History   Socioeconomic  History  . Marital status: Married    Spouse name: Not on file  . Number of children: Not on file  . Years of education: Not on file  . Highest education level: Not on file  Occupational History  . Not on file  Tobacco Use  . Smoking status: Current Every Day Smoker    Packs/day: 0.25  . Smokeless tobacco: Never Used  Substance and Sexual Activity  . Alcohol use: Yes  . Drug use: Yes    Types: Marijuana  . Sexual activity: Yes    Birth control/protection: None    Comment: Married  Other Topics Concern  . Not on file  Social History Narrative  . Not on file   Social Determinants of Health   Financial Resource Strain:   . Difficulty of Paying Living Expenses:   Food Insecurity:   . Worried About Charity fundraiser in the Last Year:   . Arboriculturist in the Last Year:   Transportation Needs:   . Film/video editor (Medical):   Marland Kitchen Lack of Transportation (Non-Medical):   Physical Activity:   . Days of Exercise per Week:   . Minutes of Exercise per Session:   Stress:   . Feeling of Stress :   Social Connections:   . Frequency of Communication with Friends and Family:   . Frequency of Social Gatherings with Friends and Family:   . Attends Religious Services:   . Active Member of Clubs or Organizations:   . Attends Archivist  Meetings:   Marland Kitchen Marital Status:   Intimate Partner Violence:   . Fear of Current or Ex-Partner:   . Emotionally Abused:   Marland Kitchen Physically Abused:   . Sexually Abused:    Family History  Problem Relation Age of Onset  . Healthy Mother   . Diabetes Father     OBJECTIVE: Vitals:   06/08/19 1335  BP: 123/90  Pulse: 79  Resp: 16  Temp: 99.2 F (37.3 C)  TempSrc: Oral  SpO2: 100%    General appearance: alert; no distress Head: NCAT Lungs: clear to auscultation bilaterally Heart: regular rate and rhythm.  Radial pulse 2+ bilaterally Extremities: no edema Skin: warm and dry; erythematous vesicular rash to bilateral forearms,  abdomen, anterior neck, chest, and face Psychological: alert and cooperative; normal mood and affect  ASSESSMENT & PLAN:  1. Rash and nonspecific skin eruption   2. Contact dermatitis, unspecified contact dermatitis type, unspecified trigger     Meds ordered this encounter  Medications  . hydrOXYzine (ATARAX/VISTARIL) 25 MG tablet    Sig: Take 1 tablet (25 mg total) by mouth every 6 (six) hours.    Dispense:  12 tablet    Refill:  0    Order Specific Question:   Supervising Provider    Answer:   Merrilee Jansky [id number]  . triamcinolone cream (KENALOG) 0.1 %    Sig: Apply 1 application topically 2 (two) times daily.    Dispense:  30 g    Refill:  0    Order Specific Question:   Supervising Provider    Answer:   Merrilee Jansky [id number]  . predniSONE (STERAPRED UNI-PAK 21 TAB) 10 MG (21) TBPK tablet    Sig: Take by mouth daily for 6 days. Take 6 tablets on day 1, 5 tablets on day 2, 4 tablets on day 3, 3 tablets on day 4, 2 tablets on day 5, 1 tablet on day 6    Dispense:  21 tablet    Refill:  0    Order Specific Question:   Supervising Provider    Answer:   Merrilee Jansky [id number]  . dexamethasone (DECADRON) injection 10 mg    Rash Contact Dermatitis Decadron 10 mg IM in office today Prescribed hydroxyzine Prescribed triamcinolone cream Prescribed prednisone taper Take as prescribed and to completion Avoid hot showers/ baths Moisturize skin daily  Follow up with PCP if symptoms persists Return or go to the ER if you have any new or worsening symptoms such as fever, chills, nausea, vomiting, redness, swelling, discharge, if symptoms do not improve with medications  Reviewed expectations re: course of current medical issues. Questions answered. Outlined signs and symptoms indicating need for more acute intervention. Patient verbalized understanding. After Visit Summary given.   Moshe Cipro, NP 06/09/19 1448

## 2019-07-24 ENCOUNTER — Emergency Department (HOSPITAL_COMMUNITY)
Admission: EM | Admit: 2019-07-24 | Discharge: 2019-07-24 | Disposition: A | Discharge status: Home / Self Care | Payer: Medicaid Other | Attending: Emergency Medicine | Admitting: Emergency Medicine

## 2019-07-24 ENCOUNTER — Encounter (HOSPITAL_COMMUNITY): Payer: Self-pay | Admitting: Emergency Medicine

## 2019-07-24 ENCOUNTER — Other Ambulatory Visit: Payer: Self-pay

## 2019-07-24 ENCOUNTER — Emergency Department (HOSPITAL_COMMUNITY): Payer: Medicaid Other

## 2019-07-24 DIAGNOSIS — X501XXA Overexertion from prolonged static or awkward postures, initial encounter: Secondary | ICD-10-CM | POA: Insufficient documentation

## 2019-07-24 DIAGNOSIS — M25572 Pain in left ankle and joints of left foot: Secondary | ICD-10-CM | POA: Diagnosis present

## 2019-07-24 DIAGNOSIS — W19XXXA Unspecified fall, initial encounter: Secondary | ICD-10-CM | POA: Insufficient documentation

## 2019-07-24 DIAGNOSIS — Y929 Unspecified place or not applicable: Secondary | ICD-10-CM | POA: Insufficient documentation

## 2019-07-24 DIAGNOSIS — M79672 Pain in left foot: Secondary | ICD-10-CM

## 2019-07-24 DIAGNOSIS — F17200 Nicotine dependence, unspecified, uncomplicated: Secondary | ICD-10-CM | POA: Insufficient documentation

## 2019-07-24 DIAGNOSIS — Y939 Activity, unspecified: Secondary | ICD-10-CM | POA: Diagnosis not present

## 2019-07-24 DIAGNOSIS — Y999 Unspecified external cause status: Secondary | ICD-10-CM | POA: Insufficient documentation

## 2019-07-24 MED ORDER — KETOROLAC TROMETHAMINE 60 MG/2ML IM SOLN
30.0000 mg | Freq: Once | INTRAMUSCULAR | Status: AC
  Administered 2019-07-24: 30 mg via INTRAMUSCULAR
  Filled 2019-07-24: qty 2

## 2019-07-24 NOTE — ED Triage Notes (Signed)
Pt turned her left ankle as she was trying to walk.  Good pulses, is unable to have any weight on ankle.

## 2019-07-24 NOTE — ED Provider Notes (Signed)
[facility] EMERGENCY DEPARTMENT Provider Note   CSN: [id number] Arrival date & time: 07/24/19  0007     History Chief Complaint  Patient presents with   Ankle Pain    Jordan Gibbs is a 34 y.o. female with a history of bipolar disorder who presents to the emergency department with a chief complaint of fall.  The patient reports that she was walking outside in the dark and a pair of flip-flops just prior to arrival when her left foot fell into a small hole.  She reports that she fell onto the ground onto her bilateral legs.  She denies hitting her head, nausea, vomiting, or syncope.  She did not fall onto her outstretched hands.  Reports that she was able to eventually get up, but it took her approximately 30 minutes due to the pain.  Reports that she had had a headache earlier in the night and taken 800 mg of ibuprofen several hours previously, but no other treatment prior to arrival.  She reports that her headache is now resolved.  She has bruising to the bilateral knees and feet.  There is a small abrasion on the right foot.  She has significant swelling and pain to the left ankle.  There is also pain to the left foot.  She endorses paresthesias in the toes of the left foot.  No weakness, numbness, back pain, upper extremity pain, bilateral hip or knee pain.  No history of lower extremity injuries or surgeries previously.  No concerns for pregnancy at this time.  She is unsure when her Tdap was last updated.  The history is provided by the patient and medical records. No language interpreter was used.       Past Medical History:  Diagnosis Date   Depression     Patient Active Problem List   Diagnosis Date Noted   Bipolar affective disorder, current episode severe ([facility]) 09/28/2016    History reviewed. No pertinent surgical history.   OB History   No obstetric history on file.     Family History  Problem Relation Age of Onset   Healthy Mother     Diabetes Father     Social History   Tobacco Use   Smoking status: Current Every Day Smoker    Packs/day: 0.25   Smokeless tobacco: Never Used  Substance Use Topics   Alcohol use: Yes   Drug use: Yes    Types: Marijuana    Home Medications Prior to Admission medications   Medication Sig Start Date End Date Taking? Authorizing Provider  benzonatate (TESSALON) 200 MG capsule Take 1 capsule (200 mg total) by mouth 2 (two) times daily as needed for cough. 05/16/18   Eustace Moore, MD  cetirizine (ZYRTEC ALLERGY) 10 MG tablet Take 1 tablet (10 mg total) by mouth daily. 04/20/19 05/20/19  Darr, Veryl Speak, PA-C  dextromethorphan-guaiFENesin (MUCINEX DM) 30-600 MG 12hr tablet Take 1 tablet by mouth 2 (two) times daily. 05/16/18   Eustace Moore, MD  fluticasone [facility]) 50 MCG/ACT nasal spray Place 1 spray into both nostrils daily. 04/20/19   Darr, Veryl Speak, PA-C  hydrOXYzine (ATARAX/VISTARIL) 25 MG tablet Take 1 tablet (25 mg total) by mouth every 6 (six) hours. 06/08/19   Moshe Cipro, NP  ondansetron (ZOFRAN ODT) 4 MG disintegrating tablet Take 1 tablet (4 mg total) by mouth every 8 (eight) hours as needed for nausea or vomiting. 02/20/19   Gailen Shelter, PA  Pseudoephedrine HCl, Deter, 30 MG TABA  Take 30 mg by mouth every 4 (four) hours as needed. 04/20/19   Darr, Veryl Speak, PA-C  triamcinolone cream (KENALOG) 0.1 % Apply 1 application topically 2 (two) times daily. 06/08/19   Moshe Cipro, NP    Allergies    Patient has no known allergies.  Review of Systems   Review of Systems  Constitutional: Negative for activity change.  Respiratory: Negative for shortness of breath.   Cardiovascular: Negative for chest pain.  Gastrointestinal: Negative for abdominal pain.  Musculoskeletal: Positive for arthralgias, gait problem, joint swelling and myalgias. Negative for back pain, neck pain and neck stiffness.  Skin: Positive for wound. Negative for rash.  Neurological:  Negative for weakness and numbness.       Paresthesias    Physical Exam Updated Vital Signs BP (!) 137/66 (BP Location: Right Arm)    Pulse 75    Temp 99 F (37.2 C) (Oral)    Resp 16    SpO2 98%   Physical Exam Vitals and nursing note reviewed.  Constitutional:      General: She is not in acute distress. HENT:     Head: Normocephalic.  Eyes:     Conjunctiva/sclera: Conjunctivae normal.  Cardiovascular:     Rate and Rhythm: Normal rate and regular rhythm.     Heart sounds: No murmur heard.  No friction rub. No gallop.   Pulmonary:     Effort: Pulmonary effort is normal. No respiratory distress.  Abdominal:     General: There is no distension.     Palpations: Abdomen is soft.  Musculoskeletal:        General: Swelling, tenderness and signs of injury present. No deformity.     Cervical back: Neck supple.     Comments: Medial and lateral malleolus of the left ankle is edematous and tender to palpation.  Decreased strength against resistance of the left lower extremity secondary to pain.  DP and PT pulses are 2+ and symmetric.  Sensation is intact and equal throughout the bilateral lower extremities.  Good capillary refill of the digits of the bilateral feet.  She is tender to palpation to the dorsum of the left foot.  Normal exam of the bilateral knees.  Exam of the right ankle is unremarkable.  No tenderness palpation to the right foot.  Bilateral hips are nontender.  Skin:    General: Skin is warm.     Findings: Bruising present. No rash.     Comments: Bruising and superficial abrasions noted to the right knee and to the right foot.  Neurological:     Mental Status: She is alert.  Psychiatric:        Behavior: Behavior normal.     ED Results / Procedures / Treatments   Labs (all labs ordered are listed, but only abnormal results are displayed) Labs Reviewed - No data to display  EKG None  Radiology DG Ankle Left Port  Result Date: 07/24/2019 CLINICAL DATA:  Pain  EXAM: PORTABLE LEFT ANKLE - 2 VIEW COMPARISON:  None. FINDINGS: There is soft tissue swelling about the ankle without evidence for an acute displaced fracture or dislocation. There is a small plantar calcaneal spur. IMPRESSION: Soft tissue swelling without evidence for an acute displaced fracture or dislocation. Electronically Signed   By: Katherine Mantle M.D.   On: 07/24/2019 00:45    Procedures Procedures (including critical care time)  Medications Ordered in ED Medications  ketorolac (TORADOL) injection 30 mg (30 mg Intramuscular Given 07/24/19 0511)  ED Course  I have reviewed the triage vital signs and the nursing notes.  Pertinent labs & imaging results that were available during my care of the patient were reviewed by me and considered in my medical decision making (see chart for details).    MDM Rules/Calculators/A&P                          34 year old female who presents to the emergency department after a fall.  Her left foot went into a hole and she fell forward onto her bilateral knees and twisted her left ankle.  She is neurovascularly intact.  There is some bruising and superficial abrasions noted to the right lower extremity.  No lacerations.  Tdap is up-to-date.  Maximal tenderness palpation is to the left ankle and there is swelling noted to both the medial and lateral malleolus as well as diffuse tenderness to palpation.  She also has some tenderness to palpation to the dorsum of the left foot.  X-ray of the ankle was obtained by triage, which is negative for fracture.  I offered the patient an x-ray of the left foot, given that she had tenderness to the dorsum of the foot on palpation but she declined as she was ready to go home.  Discussed that I cannot rule out a fracture of the foot given the mechanism of her injury without an x-ray, but she again declined.  We will give crutches and place the patient in a cam walker to cover for a possible fracture of the left foot as  well as an occult fracture of the left ankle since she declined x-ray today.  She will also be given a referral to orthopedics.  All questions answered.  Pain controlled in the ER.  She is hemodynamically stable and in no acute distress.  ER return precautions given.  Safe for discharge to home with outpatient follow-up as indicated.  Final Clinical Impression(s) / ED Diagnoses Final diagnoses:  Fall  Acute left ankle pain  Left foot pain    Rx / DC Orders ED Discharge Orders    None       Barkley Boards, PA-C 07/24/19 0535    Gilda Crease, MD 07/24/19 [phone]

## 2019-07-24 NOTE — Discharge Instructions (Signed)
Thank you for allowing me to care for you today in the Emergency Department.   You were seen for a fall. You have an ankle sprain.   Take 650 mg of Tylenol or 600 mg of ibuprofen with food every 6 hours for pain.  You can alternate between these 2 medications every 3 hours if your pain returns.  For instance, you can take Tylenol at noon, followed by a dose of ibuprofen at 3, followed by second dose of Tylenol and 6.  Elevate your ankle and foot so that your toes are at or above the level of your nose while you are sitting. Apply an ice back for 15-20 minutes 3-4 daily for the next 5 days to help with pain and swelling.   Use the crutches until you can put weight on your left foot without significant pain. Wear the boot until your symptoms significantly improve. If they do not in the next week, follow up for a recheck with orthopaedics.  Return to the ER for a new fall, new numbness, weakness, if your toes turn blue, or if you develop new, other concerning symptoms.

## 2019-07-24 NOTE — Progress Notes (Signed)
Orthopedic Tech Progress Note Patient Details:  Clemmie Buelna [birth year] [id number]  Ortho Devices Type of Ortho Device: Crutches, CAM walker Ortho Device/Splint Location: LLE Ortho Device/Splint Interventions: Application, Adjustment   Post Interventions Patient Tolerated: Well, Ambulated well Instructions Provided: Poper ambulation with device, Adjustment of device   Chimene Salo E Christelle Igoe 07/24/2019, 5:39 AM

## 2019-12-30 ENCOUNTER — Ambulatory Visit (HOSPITAL_COMMUNITY)
Admission: EM | Admit: 2019-12-30 | Discharge: 2019-12-30 | Disposition: A | Discharge status: Home / Self Care | Payer: Medicaid Other | Attending: Urgent Care | Admitting: Urgent Care

## 2019-12-30 ENCOUNTER — Other Ambulatory Visit: Payer: Self-pay

## 2019-12-30 ENCOUNTER — Encounter (HOSPITAL_COMMUNITY): Payer: Self-pay

## 2019-12-30 DIAGNOSIS — Z20822 Contact with and (suspected) exposure to covid-19: Secondary | ICD-10-CM | POA: Insufficient documentation

## 2019-12-30 DIAGNOSIS — J069 Acute upper respiratory infection, unspecified: Secondary | ICD-10-CM

## 2019-12-30 DIAGNOSIS — R059 Cough, unspecified: Secondary | ICD-10-CM | POA: Insufficient documentation

## 2019-12-30 DIAGNOSIS — F17210 Nicotine dependence, cigarettes, uncomplicated: Secondary | ICD-10-CM | POA: Diagnosis not present

## 2019-12-30 DIAGNOSIS — Z79899 Other long term (current) drug therapy: Secondary | ICD-10-CM | POA: Diagnosis not present

## 2019-12-30 DIAGNOSIS — R519 Headache, unspecified: Secondary | ICD-10-CM | POA: Insufficient documentation

## 2019-12-30 LAB — SARS CORONAVIRUS 2 (TAT 6-24 HRS): SARS Coronavirus 2: NEGATIVE

## 2019-12-30 MED ORDER — BENZONATATE 200 MG PO CAPS: 200.0000 mg | ORAL_CAPSULE | Freq: Three times a day (TID) | ORAL | 0 refills | Status: AC | PRN

## 2019-12-30 MED ORDER — PSEUDOEPHEDRINE HCL 60 MG PO TABS: 60.0000 mg | ORAL_TABLET | Freq: Three times a day (TID) | ORAL | 0 refills | Status: AC | PRN

## 2019-12-30 MED ORDER — PROMETHAZINE-DM 6.25-15 MG/5ML PO SYRP: 5.0000 mL | ORAL_SOLUTION | Freq: Every evening | ORAL | 0 refills | Status: AC | PRN

## 2019-12-30 MED ORDER — CETIRIZINE HCL 10 MG PO TABS: 10.0000 mg | ORAL_TABLET | Freq: Every day | ORAL | 0 refills | Status: AC

## 2019-12-30 NOTE — Discharge Instructions (Addendum)

## 2019-12-30 NOTE — ED Triage Notes (Signed)
Pt presents with cough, sore throat and headache after COVID exposure 4 days ago. Denies shortness of breath, fever.

## 2019-12-30 NOTE — ED Provider Notes (Signed)
[facility]   MRN: [id number] DOB: 07-05-1985  Subjective:   Jordan Gibbs is a 33 y.o. female presenting for 4-day history of acute onset throat pain, headache, sinus congestion, coughing.  Patient only had 1 dose of her Covid vaccine back in May.  She did not get flu vaccinated.  They did have very close exposure to one of her family members this past week.  Denies loss of sense of taste and smell, chest pain, shortness of breath, fevers.  Patient does not have any history of respiratory disorders.  But she has a smoker.  No current facility-administered medications for this encounter.  Current Outpatient Medications:  .  benzonatate (TESSALON) 200 MG capsule, Take 1 capsule (200 mg total) by mouth 2 (two) times daily as needed for cough., Disp: 20 capsule, Rfl: 0 .  cetirizine (ZYRTEC ALLERGY) 10 MG tablet, Take 1 tablet (10 mg total) by mouth daily., Disp: 30 tablet, Rfl: 0 .  dextromethorphan-guaiFENesin (MUCINEX DM) 30-600 MG 12hr tablet, Take 1 tablet by mouth 2 (two) times daily., Disp: 20 tablet, Rfl: 0 .  fluticasone (FLONASE) 50 MCG/ACT nasal spray, Place 1 spray into both nostrils daily., Disp: 11.1 mL, Rfl: 2 .  hydrOXYzine (ATARAX/VISTARIL) 25 MG tablet, Take 1 tablet (25 mg total) by mouth every 6 (six) hours., Disp: 12 tablet, Rfl: 0 .  ondansetron (ZOFRAN ODT) 4 MG disintegrating tablet, Take 1 tablet (4 mg total) by mouth every 8 (eight) hours as needed for nausea or vomiting., Disp: 20 tablet, Rfl: 0 .  Pseudoephedrine HCl, Deter, 30 MG TABA, Take 30 mg by mouth every 4 (four) hours as needed., Disp: 30 tablet, Rfl: 0 .  triamcinolone cream (KENALOG) 0.1 %, Apply 1 application topically 2 (two) times daily., Disp: 30 g, Rfl: 0   No Known Allergies  Past Medical History:  Diagnosis Date  . Depression      History reviewed. No pertinent surgical history.  Family History  Problem Relation Age of Onset  . Healthy Mother   . Diabetes Father      Social History   Tobacco Use  . Smoking status: Current Every Day Smoker    Packs/day: 0.25  . Smokeless tobacco: Never Used  Substance Use Topics  . Alcohol use: Yes  . Drug use: Yes    Types: Marijuana    ROS   Objective:   Vitals: BP 125/72 (BP Location: Right Arm)   Pulse 90   Temp 97.6 F (36.4 C) (Oral)   Resp 20   LMP  (Within Months) Comment: 1 month   SpO2 99%   Physical Exam Constitutional:      General: She is not in acute distress.    Appearance: Normal appearance. She is well-developed. She is obese. She is not ill-appearing, toxic-appearing or diaphoretic.  HENT:     Head: Normocephalic and atraumatic.     Nose: Nose normal.     Mouth/Throat:     Mouth: Mucous membranes are moist.  Eyes:     Extraocular Movements: Extraocular movements intact.     Pupils: Pupils are equal, round, and reactive to light.  Cardiovascular:     Rate and Rhythm: Normal rate and regular rhythm.     Pulses: Normal pulses.     Heart sounds: Normal heart sounds. No murmur heard. No friction rub. No gallop.   Pulmonary:     Effort: Pulmonary effort is normal. No respiratory distress.     Breath sounds: Normal breath sounds.  No stridor. No wheezing, rhonchi or rales.  Skin:    General: Skin is warm and dry.     Findings: No rash.  Neurological:     Mental Status: She is alert and oriented to person, place, and time.     Cranial Nerves: No cranial nerve deficit.     Motor: No weakness.     Coordination: Coordination normal.     Gait: Gait normal.  Psychiatric:        Mood and Affect: Mood normal. Mood is not anxious or depressed.        Speech: Speech normal.        Behavior: Behavior normal. Behavior is not agitated.        Thought Content: Thought content normal.     Assessment and Plan :   PDMP not reviewed this encounter.  1. Viral URI with cough   2. Bad headache     Will manage for viral illness such as viral URI, viral syndrome, viral rhinitis,  COVID-19. Counseled patient on nature of COVID-19 including modes of transmission, diagnostic testing, management and supportive care.  Offered scripts for symptomatic relief. COVID 19 testing is pending. Counseled patient on potential for adverse effects with medications prescribed/recommended today, ER and return-to-clinic precautions discussed, patient verbalized understanding.     Wallis Bamberg, [place] 12/30/19 1642

## 2020-06-13 ENCOUNTER — Other Ambulatory Visit: Payer: Self-pay

## 2020-06-13 ENCOUNTER — Ambulatory Visit (HOSPITAL_COMMUNITY)
Admission: EM | Admit: 2020-06-13 | Discharge: 2020-06-13 | Disposition: A | Discharge status: Home / Self Care | Payer: Medicaid Other | Attending: Emergency Medicine | Admitting: Emergency Medicine

## 2020-06-13 ENCOUNTER — Encounter (HOSPITAL_COMMUNITY): Payer: Self-pay | Admitting: Emergency Medicine

## 2020-06-13 DIAGNOSIS — B354 Tinea corporis: Secondary | ICD-10-CM

## 2020-06-13 DIAGNOSIS — L550 Sunburn of first degree: Secondary | ICD-10-CM

## 2020-06-13 MED ORDER — FLUCONAZOLE 150 MG PO TABS: 150.0000 mg | ORAL_TABLET | ORAL | 0 refills | Status: AC

## 2020-06-13 MED ORDER — CLOTRIMAZOLE 1 % EX CREA: TOPICAL_CREAM | CUTANEOUS | 3 refills | Status: AC

## 2020-06-13 MED ORDER — LIDOCAINE 2 % EX GEL: 1.0000 "application " | Freq: Three times a day (TID) | CUTANEOUS | 1 refills | Status: AC | PRN

## 2020-06-13 NOTE — ED Provider Notes (Signed)
[facility]  ____________________________________________  Time seen: Approximately 8:04 PM  I have reviewed the triage vital signs and the nursing notes.   HISTORY  Chief Complaint Sunburn and Rash    HPI Jordan Gibbs is a 35 y.o. female who presents the emergency department complaining of 2 separate skin conditions.  Patient states that she had sunburn 3 days ago and the pain specifically in her back is keeping her awake at nighttime.  She has used aloe vera spray but no other medications topically.  She has tried Tylenol Motrin but is not getting adequate sleep due to sunburn.  Patient is also concern for circular spots to the arm, neck.  She states that these have been present times several months but did change colors with the sunlight.  She would like these evaluated as well.  No other complaints at this time.       Past Medical History:  Diagnosis Date   Depression     Patient Active Problem List   Diagnosis Date Noted   Bipolar affective disorder, current episode severe (HCC) 09/28/2016    No past surgical history on file.  Prior to Admission medications   Medication Sig Start Date End Date Taking? Authorizing Provider  clotrimazole (LOTRIMIN) 1 % cream Apply to affected area 2 times daily for 4-6 weeks 06/13/20  Yes Tamre Cass, Delorise Royals, PA-C  fluconazole (DIFLUCAN) 150 MG tablet Take 1 tablet (150 mg total) by mouth once a week for 6 doses. 06/13/20 07/19/20 Yes Jelina Paulsen, Delorise Royals, PA-C  Lidocaine 2 % GEL Apply 1 application topically 3 (three) times daily as needed. 06/13/20  Yes Coriann Brouhard, Delorise Royals, PA-C  benzonatate (TESSALON) 200 MG capsule Take 1 capsule (200 mg total) by mouth 3 (three) times daily as needed for cough. Patient not taking: Reported on 06/13/2020 12/30/19   Wallis Bamberg, PA-C  cetirizine (ZYRTEC ALLERGY) 10 MG tablet Take 1 tablet (10 mg total) by mouth daily. Patient not taking: Reported on 06/13/2020 12/30/19   Wallis Bamberg, PA-C   dextromethorphan-guaiFENesin [facility] DM) 30-600 MG 12hr tablet Take 1 tablet by mouth 2 (two) times daily. Patient not taking: Reported on 06/13/2020 05/16/18   Eustace Moore, MD  fluticasone [facility]) 50 MCG/ACT nasal spray Place 1 spray into both nostrils daily. Patient not taking: Reported on 06/13/2020 04/20/19   Darr, Gerilyn Pilgrim, PA-C  hydrOXYzine (ATARAX/VISTARIL) 25 MG tablet Take 1 tablet (25 mg total) by mouth every 6 (six) hours. Patient not taking: Reported on 06/13/2020 06/08/19   Moshe Cipro, NP  ondansetron (ZOFRAN ODT) 4 MG disintegrating tablet Take 1 tablet (4 mg total) by mouth every 8 (eight) hours as needed for nausea or vomiting. Patient not taking: Reported on 06/13/2020 02/20/19   Gailen Shelter, PA  promethazine-dextromethorphan (PROMETHAZINE-DM) 6.25-15 MG/5ML syrup Take 5 mLs by mouth at bedtime as needed for cough. Patient not taking: Reported on 06/13/2020 12/30/19   Wallis Bamberg, PA-C  pseudoephedrine (SUDAFED) 60 MG tablet Take 1 tablet (60 mg total) by mouth every 8 (eight) hours as needed for congestion. Patient not taking: Reported on 06/13/2020 12/30/19   Wallis Bamberg, PA-C  triamcinolone cream (KENALOG) 0.1 % Apply 1 application topically 2 (two) times daily. Patient not taking: Reported on 06/13/2020 06/08/19   Moshe Cipro, NP    Allergies Patient has no known allergies.  Family History  Problem Relation Age of Onset   Healthy Mother    Diabetes Father     Social History Social History   Tobacco  Use   Smoking status: Former    Packs/day: 0.25    Pack years: 0.00    Types: Cigarettes   Smokeless tobacco: Never  Vaping Use   Vaping Use: Never used  Substance Use Topics   Alcohol use: Not Currently   Drug use: Yes    Types: Marijuana     Review of Systems  Constitutional: No fever/chills Eyes: No visual changes. No discharge ENT: No upper respiratory complaints. Cardiovascular: no chest pain. Respiratory: no cough. No  SOB. Gastrointestinal: No abdominal pain.  No nausea, no vomiting.  No diarrhea.  No constipation. Musculoskeletal: Negative for musculoskeletal pain. Skin: Sunburn x3 days, circular spots time several months to the neck and arms Neurological: Negative for headaches, focal weakness or numbness.  10 System ROS otherwise negative.  ____________________________________________   PHYSICAL EXAM:  VITAL SIGNS: ED Triage Vitals [06/13/20 2003]  Enc Vitals Group     BP      Pulse      Resp      Temp      Temp src      SpO2      Weight      Height      Head Circumference      Peak Flow      Pain Score 8     Pain Loc      Pain Edu?      Excl. in GC?      Constitutional: Alert and oriented. Well appearing and in no acute distress. Eyes: Conjunctivae are normal. PERRL. EOMI. Head: Atraumatic. ENT:      Ears:       Nose: No congestion/rhinnorhea.      Mouth/Throat: Mucous membranes are moist.  Neck: No stridor.    Cardiovascular: Normal rate, regular rhythm. Normal S1 and S2.  Good peripheral circulation. Respiratory: Normal respiratory effort without tachypnea or retractions. Lungs CTAB. Good air entry to the bases with no decreased or absent breath sounds. Musculoskeletal: Full range of motion to all extremities. No gross deformities appreciated. Neurologic:  Normal speech and language. No gross focal neurologic deficits are appreciated.  Skin:  Skin is warm, dry and intact.  Patient with diffuse first-degree sunburn to the shoulders, upper arms and back.  No bullae or vesicle formation concerning for second-degree burn.  Patient has findings to the arm and neck consistent with ringworm.  No other skin lesions Psychiatric: Mood and affect are normal. Speech and behavior are normal. Patient exhibits appropriate insight and judgement.   ____________________________________________   LABS (all labs ordered are listed, but only abnormal results are displayed)  Labs Reviewed -  No data to display ____________________________________________  EKG   ____________________________________________  RADIOLOGY   No results found.  ____________________________________________    PROCEDURES  Procedure(s) performed:    Procedures    Medications - No data to display   ____________________________________________   INITIAL IMPRESSION / ASSESSMENT AND PLAN / ED COURSE  Pertinent labs & imaging results that were available during my care of the patient were reviewed by me and considered in my medical decision making (see chart for details).  Review of the [place] CSRS was performed in accordance of the [facility] prior to dispensing any controlled drugs.           Patient's diagnosis is consistent with sunburn, ringworm.  Patient presented to the urgent care who was complaining of 2 skin conditions.  Patient has first-degree sunburn to the back and shoulders.  Patient will have prescription for  topical lidocaine to mix and follow very gentle.  Apply to burned areas for symptom relief.  Patient will have a prescription for oral Diflucan and topical clotrimazole for ringworm.  Follow-up primary care dermatology as needed.  Return precautions discussed with the patient.. . Patient is given ED precautions to return to the ED for any worsening or new symptoms.     ____________________________________________  FINAL CLINICAL IMPRESSION(S) / DIAGNOSES  Final diagnoses:  Sunburn of first degree  Ringworm of body      NEW MEDICATIONS STARTED DURING THIS VISIT:  ED Discharge Orders          Ordered    Lidocaine 2 % GEL  3 times daily PRN        06/13/20 2020    clotrimazole (LOTRIMIN) 1 % cream        06/13/20 2020    fluconazole (DIFLUCAN) 150 MG tablet  Weekly        06/13/20 2020                This chart was dictated using voice recognition software/Dragon. Despite best efforts to proofread, errors can occur which can change the meaning. Any  change was purely unintentional.    Racheal Patches, PA-C 06/13/20 2020

## 2020-06-13 NOTE — ED Triage Notes (Signed)
Rash on right arm has been present for 3 months.  Sun burn occurred 3 days ago and is uncomfortable

## 2020-11-14 IMAGING — DX CHEST - 2 VIEW
2 series · 2 of 2 positions shown · non-contrast
Comparison: None.

CLINICAL DATA: Productive cough

EXAM:
CHEST - 2 VIEW

[chest pa]
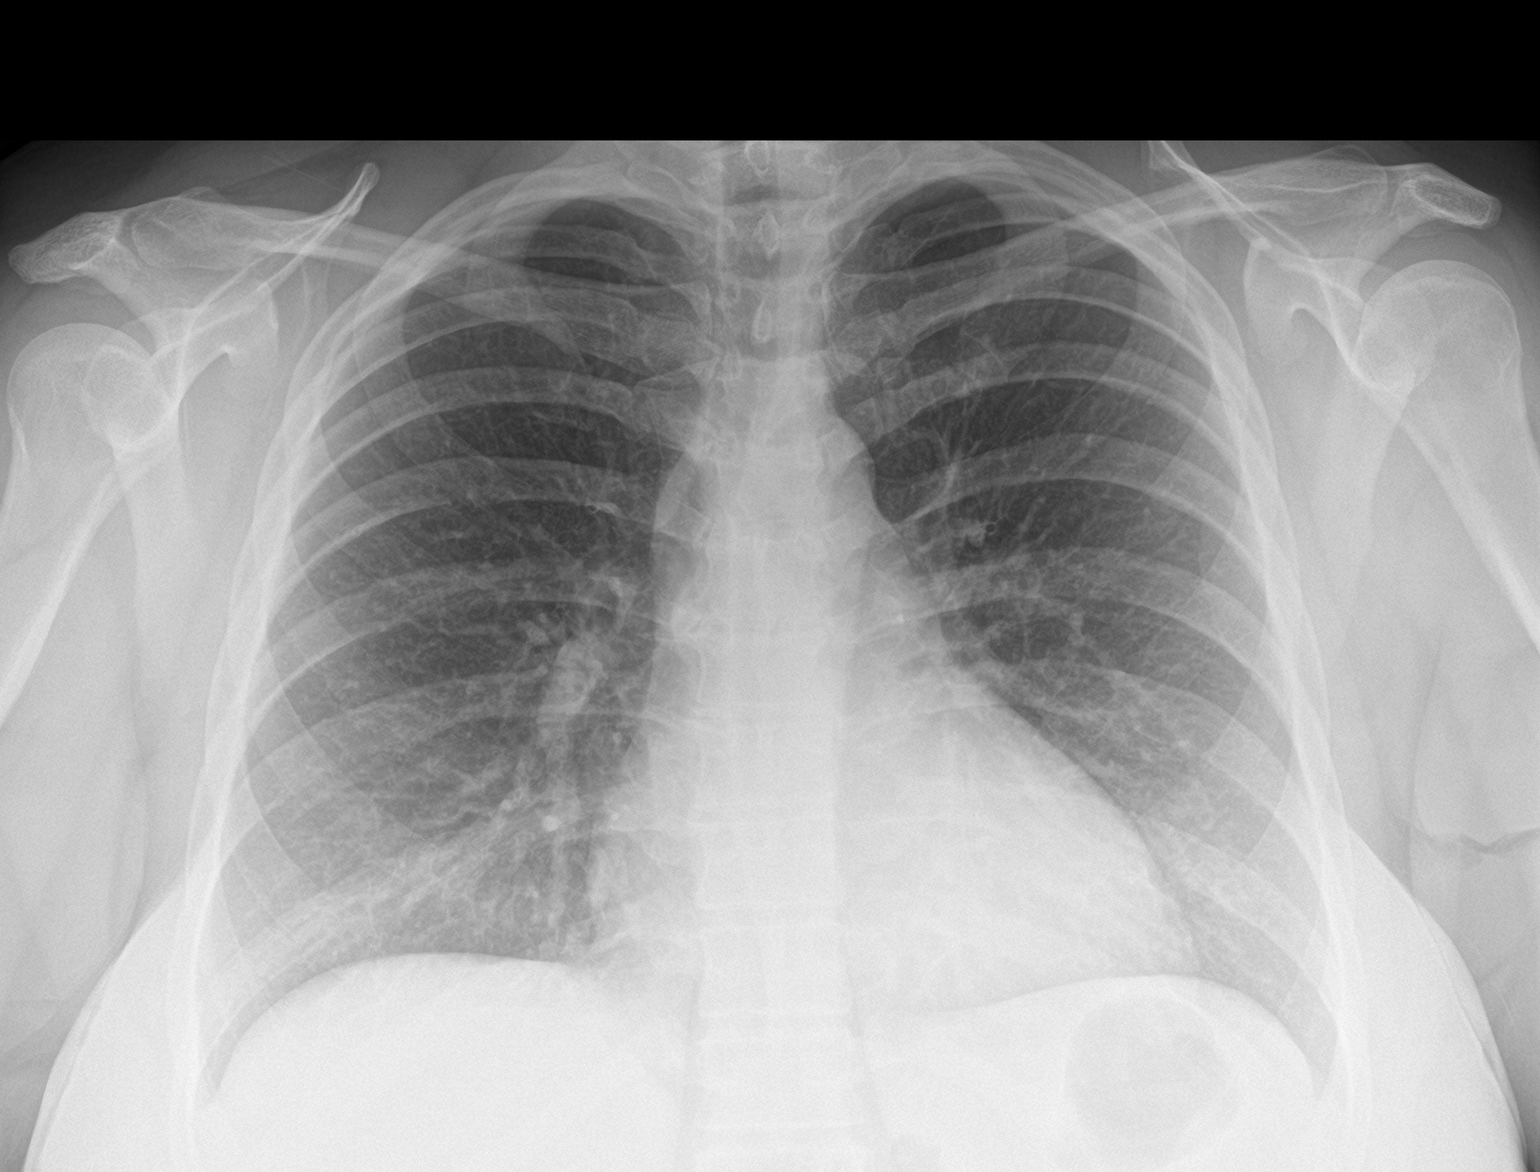

[chest lat]
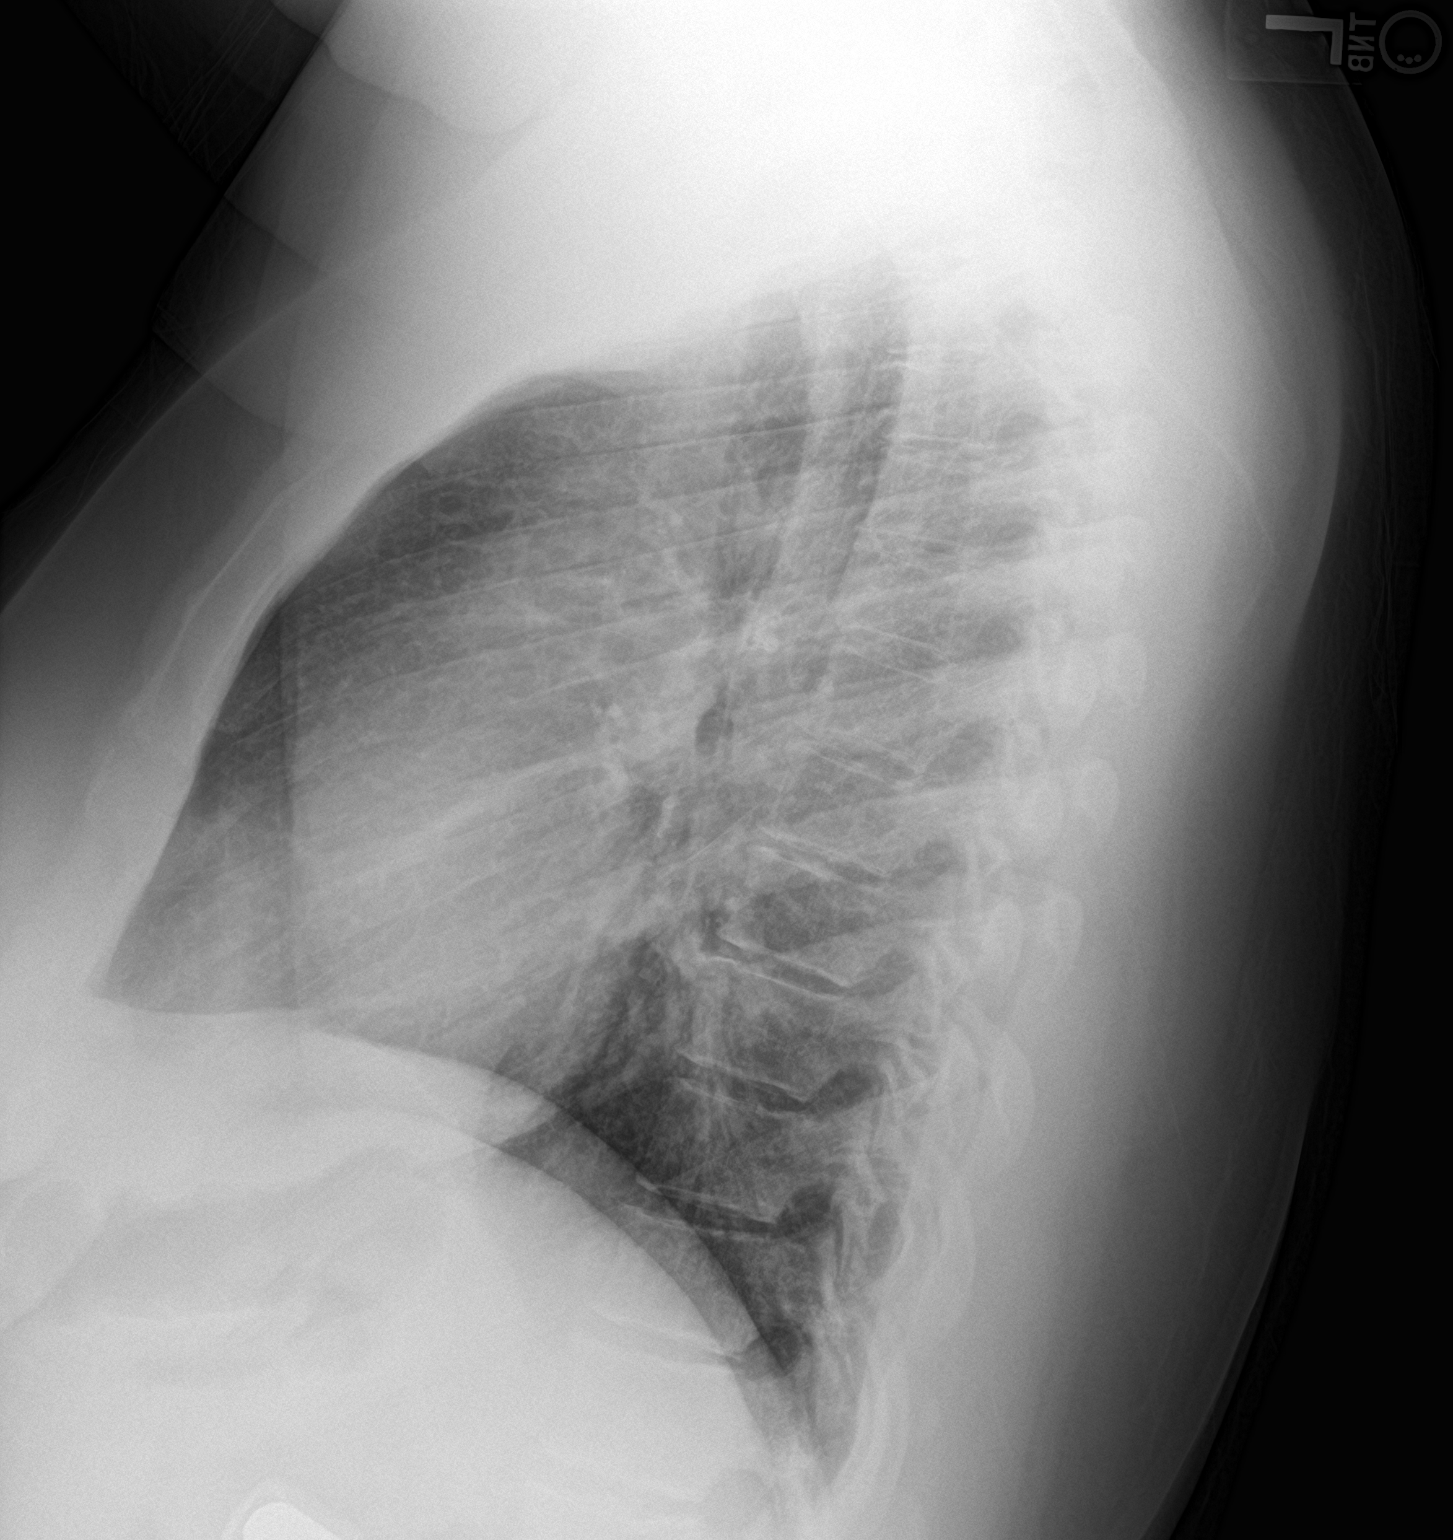

[2 of 2 positions shown; findings below may reference images not displayed]

FINDINGS: The heart size and mediastinal contours are within normal limits.
Both lungs are clear. The visualized skeletal structures are
unremarkable.
IMPRESSION: No acute abnormality of the lungs. There is no focal airspace
opacity.

## 2022-01-22 IMAGING — CR DG ANKLE PORT 2V*L*
3 series · 3 of 3 positions shown · non-contrast
Comparison: None.

CLINICAL DATA: Pain

EXAM:
PORTABLE LEFT ANKLE - 2 VIEW

[ankle lat]
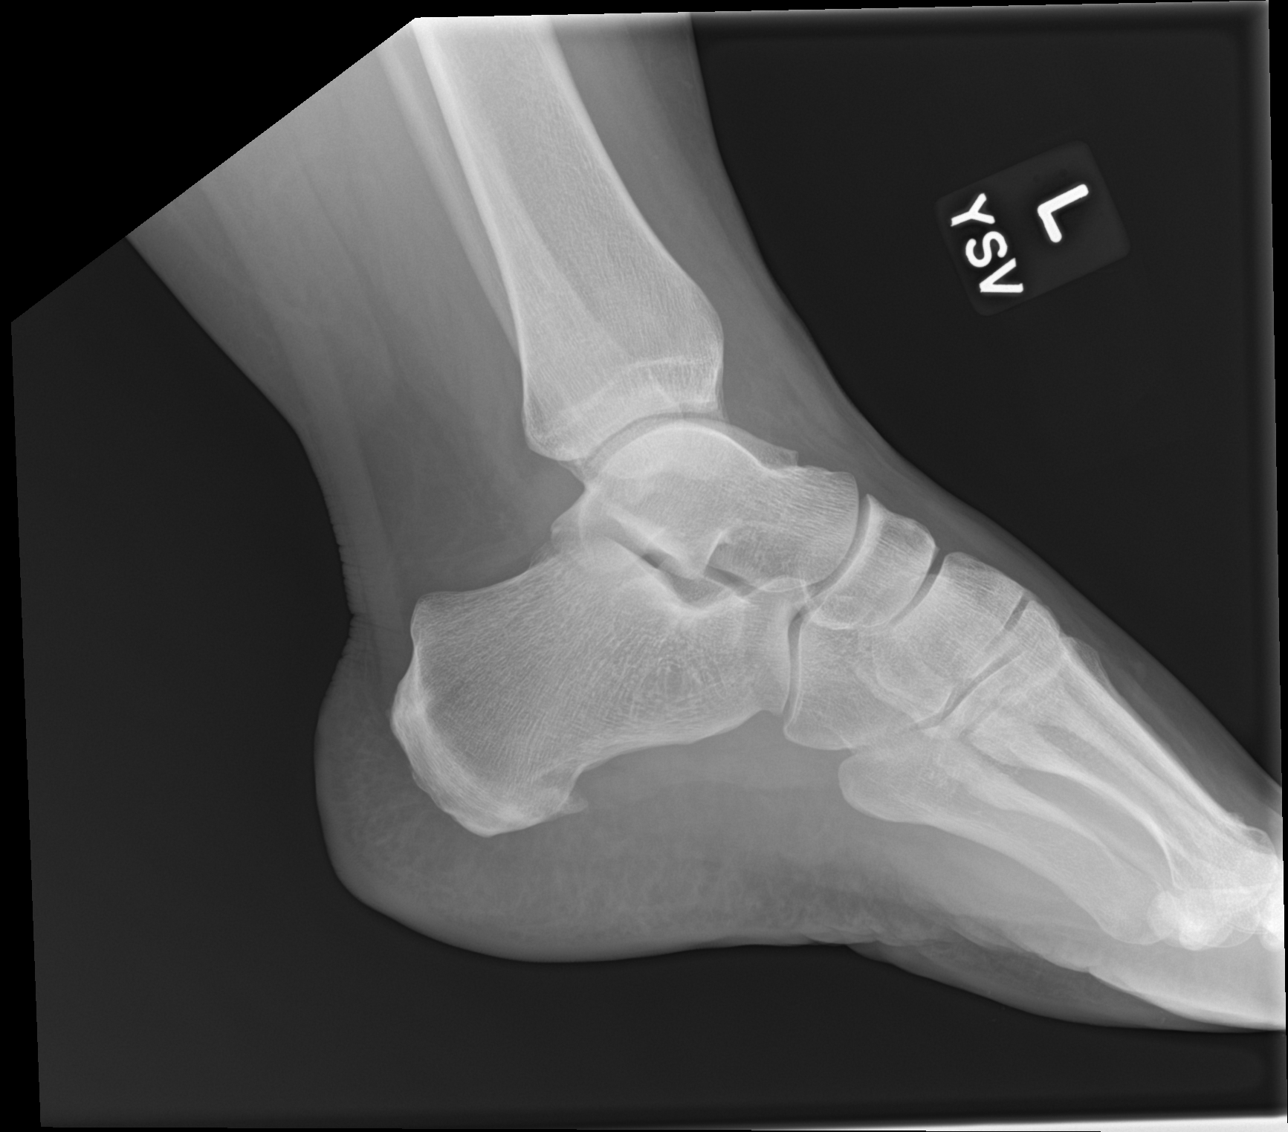

[ankle obl (1 of 2)]
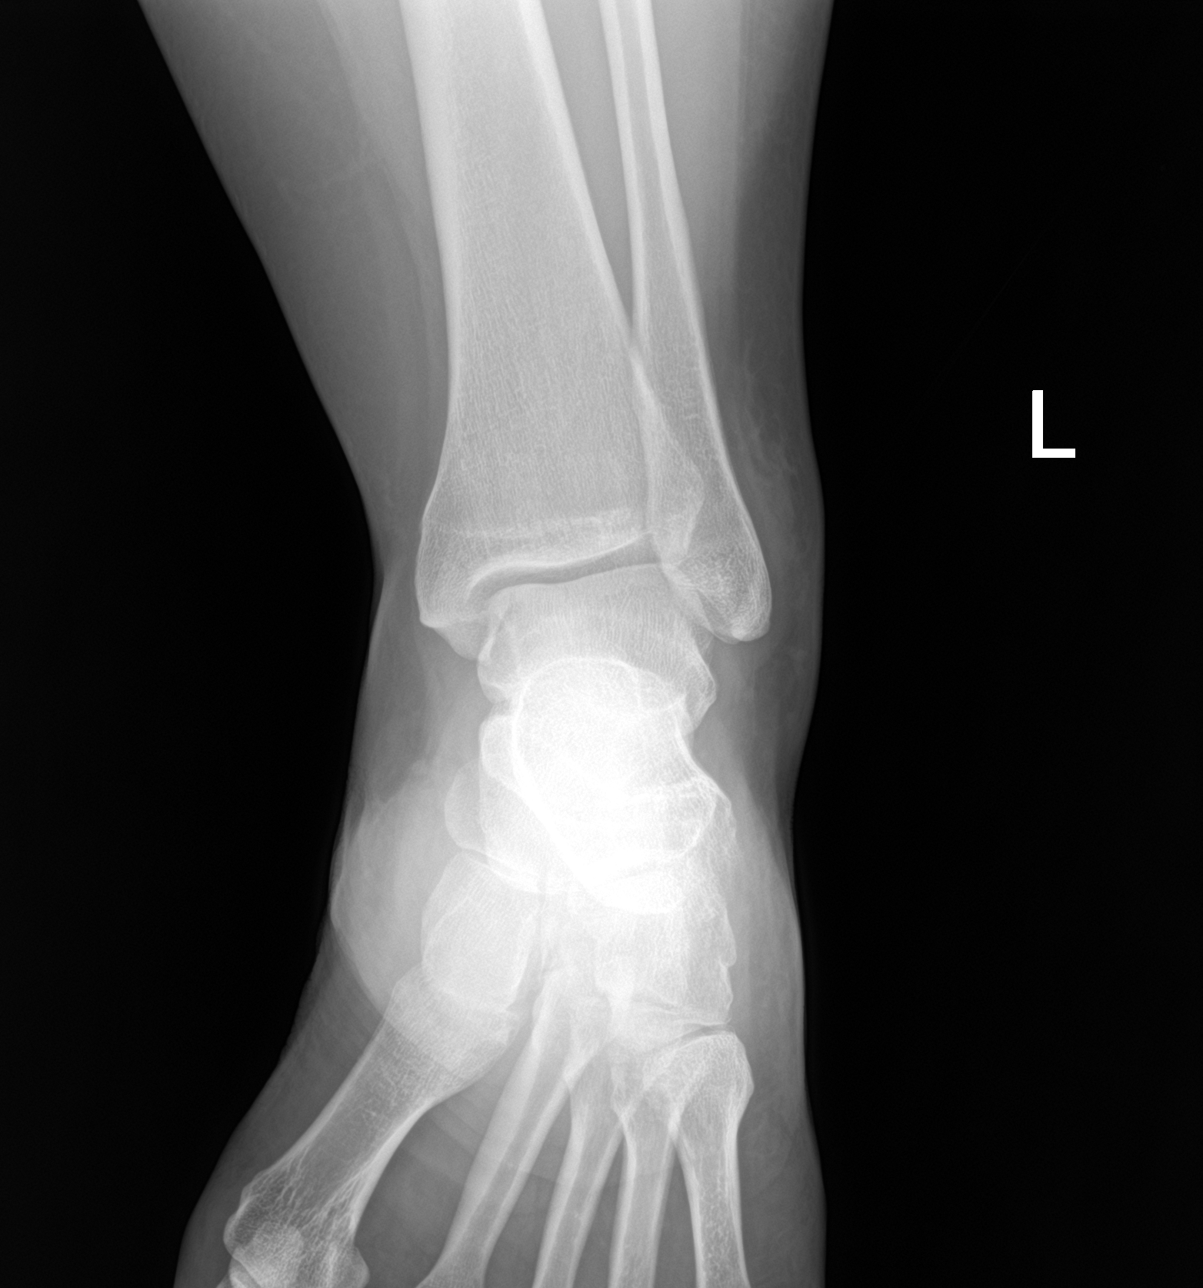

[ankle obl (2 of 2)]
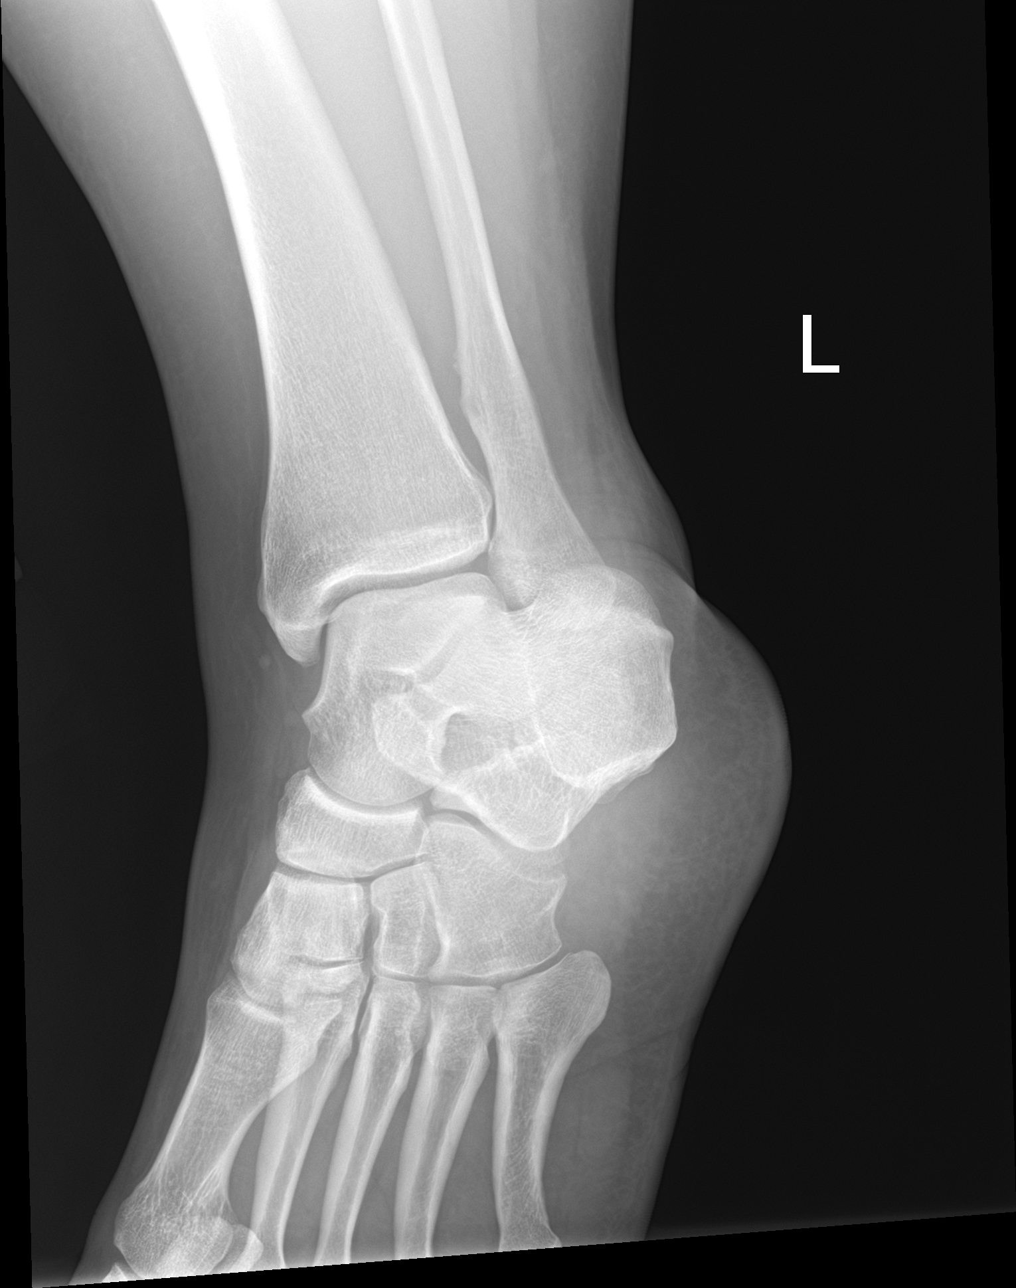

[3 of 3 positions shown; findings below may reference images not displayed]

FINDINGS: There is soft tissue swelling about the ankle without evidence for
an acute displaced fracture or dislocation. There is a small plantar
calcaneal spur.
IMPRESSION: Soft tissue swelling without evidence for an acute displaced
fracture or dislocation.
